# Patient Record
Sex: Female | Born: 1940
Health system: Southern US, Community
[De-identification: ages and names within clinical notes are randomized; demographics above are authoritative.]

## PROBLEM LIST (undated history)

## (undated) DIAGNOSIS — B0050 Herpesviral ocular disease, unspecified: Secondary | ICD-10-CM

## (undated) DIAGNOSIS — I499 Cardiac arrhythmia, unspecified: Secondary | ICD-10-CM

## (undated) DIAGNOSIS — L570 Actinic keratosis: Secondary | ICD-10-CM

## (undated) DIAGNOSIS — E039 Hypothyroidism, unspecified: Secondary | ICD-10-CM

## (undated) DIAGNOSIS — J189 Pneumonia, unspecified organism: Secondary | ICD-10-CM

## (undated) DIAGNOSIS — B019 Varicella without complication: Secondary | ICD-10-CM

## (undated) DIAGNOSIS — J45909 Unspecified asthma, uncomplicated: Secondary | ICD-10-CM

## (undated) DIAGNOSIS — M1990 Unspecified osteoarthritis, unspecified site: Secondary | ICD-10-CM

## (undated) DIAGNOSIS — T8859XA Other complications of anesthesia, initial encounter: Secondary | ICD-10-CM

## (undated) HISTORY — PX: BLADDER SUSPENSION: SHX72

## (undated) HISTORY — PX: FUNCTIONAL ENDOSCOPIC SINUS SURGERY: SUR616

## (undated) HISTORY — PX: COLONOSCOPY: SHX174

## (undated) HISTORY — DX: Actinic keratosis: L57.0

## (undated) HISTORY — PX: ABDOMINAL HYSTERECTOMY: SHX81

## (undated) HISTORY — PX: BREAST EXCISIONAL BIOPSY: SUR124

## (undated) HISTORY — PX: BREAST CYST ASPIRATION: SHX578

## (undated) HISTORY — PX: BREAST BIOPSY: SHX20

---

## 1970-02-24 HISTORY — PX: TUBAL LIGATION: SHX77

## 2004-08-22 ENCOUNTER — Ambulatory Visit: Payer: Self-pay | Admitting: Unknown Physician Specialty

## 2004-10-24 ENCOUNTER — Ambulatory Visit: Payer: Self-pay | Admitting: Unknown Physician Specialty

## 2005-06-16 ENCOUNTER — Other Ambulatory Visit: Payer: Self-pay

## 2005-06-20 ENCOUNTER — Ambulatory Visit: Payer: Self-pay | Admitting: Unknown Physician Specialty

## 2005-11-10 ENCOUNTER — Ambulatory Visit: Payer: Self-pay | Admitting: Unknown Physician Specialty

## 2006-10-12 ENCOUNTER — Ambulatory Visit: Payer: Self-pay | Admitting: Orthopaedic Surgery

## 2007-02-23 ENCOUNTER — Ambulatory Visit: Payer: Self-pay | Admitting: Unknown Physician Specialty

## 2007-02-25 HISTORY — PX: CATARACT EXTRACTION: SUR2

## 2008-03-15 ENCOUNTER — Ambulatory Visit: Payer: Self-pay | Admitting: Unknown Physician Specialty

## 2008-06-28 ENCOUNTER — Ambulatory Visit: Payer: Self-pay | Admitting: Unknown Physician Specialty

## 2009-08-21 ENCOUNTER — Ambulatory Visit: Payer: Self-pay | Admitting: Unknown Physician Specialty

## 2010-03-26 ENCOUNTER — Ambulatory Visit: Payer: Self-pay | Admitting: Ophthalmology

## 2010-04-08 ENCOUNTER — Ambulatory Visit: Payer: Self-pay | Admitting: Ophthalmology

## 2010-11-15 ENCOUNTER — Ambulatory Visit: Payer: Self-pay | Admitting: Unknown Physician Specialty

## 2011-01-08 ENCOUNTER — Ambulatory Visit: Payer: Self-pay | Admitting: Unknown Physician Specialty

## 2011-01-10 ENCOUNTER — Ambulatory Visit: Payer: Self-pay | Admitting: Unknown Physician Specialty

## 2011-11-27 ENCOUNTER — Ambulatory Visit: Payer: Self-pay | Admitting: Internal Medicine

## 2011-12-23 ENCOUNTER — Other Ambulatory Visit: Payer: Self-pay | Admitting: Internal Medicine

## 2011-12-23 LAB — TROPONIN I: Troponin-I: <0.02 ng/mL

## 2012-01-13 ENCOUNTER — Ambulatory Visit: Payer: Self-pay | Admitting: Internal Medicine

## 2013-01-19 ENCOUNTER — Ambulatory Visit: Payer: Self-pay | Admitting: Internal Medicine

## 2013-05-05 ENCOUNTER — Ambulatory Visit: Payer: Self-pay | Admitting: Unknown Physician Specialty

## 2013-05-10 LAB — PATHOLOGY REPORT

## 2014-01-24 ENCOUNTER — Ambulatory Visit: Payer: Self-pay | Admitting: Internal Medicine

## 2014-12-28 ENCOUNTER — Other Ambulatory Visit: Payer: Self-pay | Admitting: Internal Medicine

## 2014-12-28 DIAGNOSIS — Z1231 Encounter for screening mammogram for malignant neoplasm of breast: Secondary | ICD-10-CM

## 2015-01-09 ENCOUNTER — Ambulatory Visit: Payer: Self-pay

## 2015-01-10 ENCOUNTER — Ambulatory Visit: Payer: Self-pay

## 2015-01-22 ENCOUNTER — Ambulatory Visit
Admission: RE | Admit: 2015-01-22 | Discharge: 2015-01-22 | Disposition: A | Discharge status: Home / Self Care | Payer: Medicare Other | Source: Ambulatory Visit | Attending: Internal Medicine | Admitting: Internal Medicine

## 2015-01-22 DIAGNOSIS — Z1231 Encounter for screening mammogram for malignant neoplasm of breast: Secondary | ICD-10-CM

## 2015-03-28 DIAGNOSIS — M955 Acquired deformity of pelvis: Secondary | ICD-10-CM | POA: Diagnosis not present

## 2015-03-28 DIAGNOSIS — M9903 Segmental and somatic dysfunction of lumbar region: Secondary | ICD-10-CM | POA: Diagnosis not present

## 2015-03-28 DIAGNOSIS — M5416 Radiculopathy, lumbar region: Secondary | ICD-10-CM | POA: Diagnosis not present

## 2015-03-28 DIAGNOSIS — M9905 Segmental and somatic dysfunction of pelvic region: Secondary | ICD-10-CM | POA: Diagnosis not present

## 2015-03-30 DIAGNOSIS — E039 Hypothyroidism, unspecified: Secondary | ICD-10-CM | POA: Diagnosis not present

## 2015-04-17 ENCOUNTER — Other Ambulatory Visit: Payer: Self-pay | Admitting: Internal Medicine

## 2015-04-17 ENCOUNTER — Ambulatory Visit
Admission: RE | Admit: 2015-04-17 | Discharge: 2015-04-17 | Disposition: A | Discharge status: Home / Self Care | Payer: PPO | Source: Ambulatory Visit | Attending: Internal Medicine | Admitting: Internal Medicine

## 2015-04-17 DIAGNOSIS — Z1231 Encounter for screening mammogram for malignant neoplasm of breast: Secondary | ICD-10-CM | POA: Insufficient documentation

## 2015-04-25 DIAGNOSIS — B0052 Herpesviral keratitis: Secondary | ICD-10-CM | POA: Diagnosis not present

## 2015-04-26 DIAGNOSIS — B0052 Herpesviral keratitis: Secondary | ICD-10-CM | POA: Diagnosis not present

## 2015-05-01 DIAGNOSIS — B0052 Herpesviral keratitis: Secondary | ICD-10-CM | POA: Diagnosis not present

## 2015-05-03 DIAGNOSIS — M1612 Unilateral primary osteoarthritis, left hip: Secondary | ICD-10-CM | POA: Diagnosis not present

## 2015-05-03 DIAGNOSIS — M25552 Pain in left hip: Secondary | ICD-10-CM | POA: Diagnosis not present

## 2015-05-30 DIAGNOSIS — M5416 Radiculopathy, lumbar region: Secondary | ICD-10-CM | POA: Diagnosis not present

## 2015-05-30 DIAGNOSIS — M955 Acquired deformity of pelvis: Secondary | ICD-10-CM | POA: Diagnosis not present

## 2015-05-30 DIAGNOSIS — M9905 Segmental and somatic dysfunction of pelvic region: Secondary | ICD-10-CM | POA: Diagnosis not present

## 2015-05-30 DIAGNOSIS — M9903 Segmental and somatic dysfunction of lumbar region: Secondary | ICD-10-CM | POA: Diagnosis not present

## 2015-06-20 DIAGNOSIS — J4522 Mild intermittent asthma with status asthmaticus: Secondary | ICD-10-CM | POA: Diagnosis not present

## 2015-06-20 DIAGNOSIS — J40 Bronchitis, not specified as acute or chronic: Secondary | ICD-10-CM | POA: Diagnosis not present

## 2015-09-03 DIAGNOSIS — M5416 Radiculopathy, lumbar region: Secondary | ICD-10-CM | POA: Diagnosis not present

## 2015-09-03 DIAGNOSIS — M9903 Segmental and somatic dysfunction of lumbar region: Secondary | ICD-10-CM | POA: Diagnosis not present

## 2015-09-03 DIAGNOSIS — M9905 Segmental and somatic dysfunction of pelvic region: Secondary | ICD-10-CM | POA: Diagnosis not present

## 2015-09-03 DIAGNOSIS — M955 Acquired deformity of pelvis: Secondary | ICD-10-CM | POA: Diagnosis not present

## 2015-10-24 DIAGNOSIS — M9903 Segmental and somatic dysfunction of lumbar region: Secondary | ICD-10-CM | POA: Diagnosis not present

## 2015-10-24 DIAGNOSIS — M955 Acquired deformity of pelvis: Secondary | ICD-10-CM | POA: Diagnosis not present

## 2015-10-24 DIAGNOSIS — M9905 Segmental and somatic dysfunction of pelvic region: Secondary | ICD-10-CM | POA: Diagnosis not present

## 2015-10-24 DIAGNOSIS — M5416 Radiculopathy, lumbar region: Secondary | ICD-10-CM | POA: Diagnosis not present

## 2015-10-30 DIAGNOSIS — Z961 Presence of intraocular lens: Secondary | ICD-10-CM | POA: Diagnosis not present

## 2015-10-31 DIAGNOSIS — M9905 Segmental and somatic dysfunction of pelvic region: Secondary | ICD-10-CM | POA: Diagnosis not present

## 2015-10-31 DIAGNOSIS — M9903 Segmental and somatic dysfunction of lumbar region: Secondary | ICD-10-CM | POA: Diagnosis not present

## 2015-10-31 DIAGNOSIS — M5416 Radiculopathy, lumbar region: Secondary | ICD-10-CM | POA: Diagnosis not present

## 2015-10-31 DIAGNOSIS — M955 Acquired deformity of pelvis: Secondary | ICD-10-CM | POA: Diagnosis not present

## 2015-11-02 DIAGNOSIS — M9905 Segmental and somatic dysfunction of pelvic region: Secondary | ICD-10-CM | POA: Diagnosis not present

## 2015-11-02 DIAGNOSIS — M955 Acquired deformity of pelvis: Secondary | ICD-10-CM | POA: Diagnosis not present

## 2015-11-02 DIAGNOSIS — M9903 Segmental and somatic dysfunction of lumbar region: Secondary | ICD-10-CM | POA: Diagnosis not present

## 2015-11-02 DIAGNOSIS — M5416 Radiculopathy, lumbar region: Secondary | ICD-10-CM | POA: Diagnosis not present

## 2015-11-07 DIAGNOSIS — M9905 Segmental and somatic dysfunction of pelvic region: Secondary | ICD-10-CM | POA: Diagnosis not present

## 2015-11-07 DIAGNOSIS — M9903 Segmental and somatic dysfunction of lumbar region: Secondary | ICD-10-CM | POA: Diagnosis not present

## 2015-11-07 DIAGNOSIS — M955 Acquired deformity of pelvis: Secondary | ICD-10-CM | POA: Diagnosis not present

## 2015-11-07 DIAGNOSIS — M5416 Radiculopathy, lumbar region: Secondary | ICD-10-CM | POA: Diagnosis not present

## 2015-12-27 DIAGNOSIS — E039 Hypothyroidism, unspecified: Secondary | ICD-10-CM | POA: Diagnosis not present

## 2015-12-27 DIAGNOSIS — Z79899 Other long term (current) drug therapy: Secondary | ICD-10-CM | POA: Diagnosis not present

## 2015-12-31 DIAGNOSIS — Z0000 Encounter for general adult medical examination without abnormal findings: Secondary | ICD-10-CM | POA: Diagnosis not present

## 2016-01-21 DIAGNOSIS — M955 Acquired deformity of pelvis: Secondary | ICD-10-CM | POA: Diagnosis not present

## 2016-01-21 DIAGNOSIS — M9903 Segmental and somatic dysfunction of lumbar region: Secondary | ICD-10-CM | POA: Diagnosis not present

## 2016-01-21 DIAGNOSIS — M9905 Segmental and somatic dysfunction of pelvic region: Secondary | ICD-10-CM | POA: Diagnosis not present

## 2016-01-21 DIAGNOSIS — M5416 Radiculopathy, lumbar region: Secondary | ICD-10-CM | POA: Diagnosis not present

## 2016-01-24 DIAGNOSIS — M9903 Segmental and somatic dysfunction of lumbar region: Secondary | ICD-10-CM | POA: Diagnosis not present

## 2016-01-24 DIAGNOSIS — M955 Acquired deformity of pelvis: Secondary | ICD-10-CM | POA: Diagnosis not present

## 2016-01-24 DIAGNOSIS — M5416 Radiculopathy, lumbar region: Secondary | ICD-10-CM | POA: Diagnosis not present

## 2016-01-24 DIAGNOSIS — M9905 Segmental and somatic dysfunction of pelvic region: Secondary | ICD-10-CM | POA: Diagnosis not present

## 2016-01-28 DIAGNOSIS — M955 Acquired deformity of pelvis: Secondary | ICD-10-CM | POA: Diagnosis not present

## 2016-01-28 DIAGNOSIS — M5416 Radiculopathy, lumbar region: Secondary | ICD-10-CM | POA: Diagnosis not present

## 2016-01-28 DIAGNOSIS — M9903 Segmental and somatic dysfunction of lumbar region: Secondary | ICD-10-CM | POA: Diagnosis not present

## 2016-01-28 DIAGNOSIS — M9905 Segmental and somatic dysfunction of pelvic region: Secondary | ICD-10-CM | POA: Diagnosis not present

## 2016-01-31 DIAGNOSIS — M9903 Segmental and somatic dysfunction of lumbar region: Secondary | ICD-10-CM | POA: Diagnosis not present

## 2016-01-31 DIAGNOSIS — M955 Acquired deformity of pelvis: Secondary | ICD-10-CM | POA: Diagnosis not present

## 2016-01-31 DIAGNOSIS — M5416 Radiculopathy, lumbar region: Secondary | ICD-10-CM | POA: Diagnosis not present

## 2016-01-31 DIAGNOSIS — M9905 Segmental and somatic dysfunction of pelvic region: Secondary | ICD-10-CM | POA: Diagnosis not present

## 2016-02-27 DIAGNOSIS — M9903 Segmental and somatic dysfunction of lumbar region: Secondary | ICD-10-CM | POA: Diagnosis not present

## 2016-02-27 DIAGNOSIS — M9905 Segmental and somatic dysfunction of pelvic region: Secondary | ICD-10-CM | POA: Diagnosis not present

## 2016-02-27 DIAGNOSIS — M955 Acquired deformity of pelvis: Secondary | ICD-10-CM | POA: Diagnosis not present

## 2016-02-27 DIAGNOSIS — M5416 Radiculopathy, lumbar region: Secondary | ICD-10-CM | POA: Diagnosis not present

## 2016-03-14 ENCOUNTER — Other Ambulatory Visit: Payer: Self-pay | Admitting: Internal Medicine

## 2016-03-14 DIAGNOSIS — Z1231 Encounter for screening mammogram for malignant neoplasm of breast: Secondary | ICD-10-CM

## 2016-03-28 DIAGNOSIS — J4522 Mild intermittent asthma with status asthmaticus: Secondary | ICD-10-CM | POA: Diagnosis not present

## 2016-03-31 DIAGNOSIS — Z0000 Encounter for general adult medical examination without abnormal findings: Secondary | ICD-10-CM | POA: Diagnosis not present

## 2016-04-05 DIAGNOSIS — N390 Urinary tract infection, site not specified: Secondary | ICD-10-CM | POA: Diagnosis not present

## 2016-04-28 ENCOUNTER — Ambulatory Visit: Payer: PPO | Attending: Internal Medicine

## 2016-04-30 DIAGNOSIS — J40 Bronchitis, not specified as acute or chronic: Secondary | ICD-10-CM | POA: Diagnosis not present

## 2016-04-30 DIAGNOSIS — J4522 Mild intermittent asthma with status asthmaticus: Secondary | ICD-10-CM | POA: Diagnosis not present

## 2016-05-12 DIAGNOSIS — B0052 Herpesviral keratitis: Secondary | ICD-10-CM | POA: Diagnosis not present

## 2016-05-13 DIAGNOSIS — M25512 Pain in left shoulder: Secondary | ICD-10-CM | POA: Diagnosis not present

## 2016-05-13 DIAGNOSIS — M7542 Impingement syndrome of left shoulder: Secondary | ICD-10-CM | POA: Diagnosis not present

## 2016-06-04 ENCOUNTER — Ambulatory Visit
Admission: RE | Admit: 2016-06-04 | Discharge: 2016-06-04 | Disposition: A | Discharge status: Home / Self Care | Payer: PPO | Source: Ambulatory Visit | Attending: Internal Medicine | Admitting: Internal Medicine

## 2016-06-04 DIAGNOSIS — Z1231 Encounter for screening mammogram for malignant neoplasm of breast: Secondary | ICD-10-CM

## 2016-08-05 DIAGNOSIS — R238 Other skin changes: Secondary | ICD-10-CM | POA: Diagnosis not present

## 2016-09-15 DIAGNOSIS — R58 Hemorrhage, not elsewhere classified: Secondary | ICD-10-CM | POA: Diagnosis not present

## 2016-11-20 DIAGNOSIS — H179 Unspecified corneal scar and opacity: Secondary | ICD-10-CM | POA: Diagnosis not present

## 2016-11-25 DIAGNOSIS — J45902 Unspecified asthma with status asthmaticus: Secondary | ICD-10-CM | POA: Diagnosis not present

## 2016-11-25 DIAGNOSIS — J40 Bronchitis, not specified as acute or chronic: Secondary | ICD-10-CM | POA: Diagnosis not present

## 2016-12-15 DIAGNOSIS — D1800 Hemangioma unspecified site: Secondary | ICD-10-CM | POA: Diagnosis not present

## 2016-12-15 DIAGNOSIS — D692 Other nonthrombocytopenic purpura: Secondary | ICD-10-CM | POA: Diagnosis not present

## 2016-12-15 DIAGNOSIS — L430 Hypertrophic lichen planus: Secondary | ICD-10-CM | POA: Diagnosis not present

## 2016-12-15 DIAGNOSIS — D225 Melanocytic nevi of trunk: Secondary | ICD-10-CM | POA: Diagnosis not present

## 2016-12-15 DIAGNOSIS — D485 Neoplasm of uncertain behavior of skin: Secondary | ICD-10-CM | POA: Diagnosis not present

## 2016-12-15 DIAGNOSIS — Z1283 Encounter for screening for malignant neoplasm of skin: Secondary | ICD-10-CM | POA: Diagnosis not present

## 2016-12-15 DIAGNOSIS — L578 Other skin changes due to chronic exposure to nonionizing radiation: Secondary | ICD-10-CM | POA: Diagnosis not present

## 2016-12-15 DIAGNOSIS — L821 Other seborrheic keratosis: Secondary | ICD-10-CM | POA: Diagnosis not present

## 2016-12-15 DIAGNOSIS — L814 Other melanin hyperpigmentation: Secondary | ICD-10-CM | POA: Diagnosis not present

## 2016-12-29 DIAGNOSIS — Z0000 Encounter for general adult medical examination without abnormal findings: Secondary | ICD-10-CM | POA: Diagnosis not present

## 2016-12-29 DIAGNOSIS — E039 Hypothyroidism, unspecified: Secondary | ICD-10-CM | POA: Diagnosis not present

## 2016-12-29 DIAGNOSIS — E782 Mixed hyperlipidemia: Secondary | ICD-10-CM | POA: Diagnosis not present

## 2017-01-05 DIAGNOSIS — Z23 Encounter for immunization: Secondary | ICD-10-CM | POA: Diagnosis not present

## 2017-01-05 DIAGNOSIS — E039 Hypothyroidism, unspecified: Secondary | ICD-10-CM | POA: Diagnosis not present

## 2017-01-05 DIAGNOSIS — Z79899 Other long term (current) drug therapy: Secondary | ICD-10-CM | POA: Diagnosis not present

## 2017-01-05 DIAGNOSIS — Z0000 Encounter for general adult medical examination without abnormal findings: Secondary | ICD-10-CM | POA: Diagnosis not present

## 2017-01-05 DIAGNOSIS — E782 Mixed hyperlipidemia: Secondary | ICD-10-CM | POA: Diagnosis not present

## 2017-03-06 DIAGNOSIS — M25512 Pain in left shoulder: Secondary | ICD-10-CM | POA: Diagnosis not present

## 2017-03-06 DIAGNOSIS — M25552 Pain in left hip: Secondary | ICD-10-CM | POA: Diagnosis not present

## 2017-03-06 DIAGNOSIS — M7542 Impingement syndrome of left shoulder: Secondary | ICD-10-CM | POA: Diagnosis not present

## 2017-03-09 ENCOUNTER — Other Ambulatory Visit: Payer: Self-pay | Admitting: Physician Assistant

## 2017-03-09 DIAGNOSIS — M7542 Impingement syndrome of left shoulder: Secondary | ICD-10-CM

## 2017-03-17 ENCOUNTER — Ambulatory Visit
Admission: RE | Admit: 2017-03-17 | Discharge: 2017-03-17 | Disposition: A | Discharge status: Home / Self Care | Payer: PPO | Source: Ambulatory Visit | Attending: Physician Assistant | Admitting: Physician Assistant

## 2017-03-17 DIAGNOSIS — M25412 Effusion, left shoulder: Secondary | ICD-10-CM | POA: Insufficient documentation

## 2017-03-17 DIAGNOSIS — M7542 Impingement syndrome of left shoulder: Secondary | ICD-10-CM | POA: Diagnosis not present

## 2017-03-17 DIAGNOSIS — M19012 Primary osteoarthritis, left shoulder: Secondary | ICD-10-CM | POA: Insufficient documentation

## 2017-03-17 DIAGNOSIS — M7552 Bursitis of left shoulder: Secondary | ICD-10-CM | POA: Diagnosis not present

## 2017-03-24 DIAGNOSIS — N8111 Cystocele, midline: Secondary | ICD-10-CM | POA: Diagnosis not present

## 2017-03-24 DIAGNOSIS — N812 Incomplete uterovaginal prolapse: Secondary | ICD-10-CM | POA: Diagnosis not present

## 2017-03-24 DIAGNOSIS — N816 Rectocele: Secondary | ICD-10-CM | POA: Diagnosis not present

## 2017-04-01 ENCOUNTER — Other Ambulatory Visit: Payer: Self-pay | Admitting: Internal Medicine

## 2017-04-01 DIAGNOSIS — Z1231 Encounter for screening mammogram for malignant neoplasm of breast: Secondary | ICD-10-CM

## 2017-04-06 DIAGNOSIS — M19012 Primary osteoarthritis, left shoulder: Secondary | ICD-10-CM | POA: Diagnosis not present

## 2017-04-06 DIAGNOSIS — M7522 Bicipital tendinitis, left shoulder: Secondary | ICD-10-CM | POA: Diagnosis not present

## 2017-04-06 DIAGNOSIS — M7582 Other shoulder lesions, left shoulder: Secondary | ICD-10-CM | POA: Diagnosis not present

## 2017-04-15 DIAGNOSIS — N816 Rectocele: Secondary | ICD-10-CM | POA: Diagnosis not present

## 2017-04-15 DIAGNOSIS — N8111 Cystocele, midline: Secondary | ICD-10-CM | POA: Diagnosis not present

## 2017-04-15 DIAGNOSIS — N812 Incomplete uterovaginal prolapse: Secondary | ICD-10-CM | POA: Diagnosis not present

## 2017-04-16 DIAGNOSIS — M6281 Muscle weakness (generalized): Secondary | ICD-10-CM | POA: Diagnosis not present

## 2017-04-16 DIAGNOSIS — M25512 Pain in left shoulder: Secondary | ICD-10-CM | POA: Diagnosis not present

## 2017-04-16 DIAGNOSIS — M7522 Bicipital tendinitis, left shoulder: Secondary | ICD-10-CM | POA: Diagnosis not present

## 2017-04-16 DIAGNOSIS — M7582 Other shoulder lesions, left shoulder: Secondary | ICD-10-CM | POA: Diagnosis not present

## 2017-04-20 ENCOUNTER — Other Ambulatory Visit: Payer: Self-pay

## 2017-04-20 ENCOUNTER — Ambulatory Visit: Payer: PPO | Attending: Obstetrics and Gynecology

## 2017-04-20 DIAGNOSIS — N8110 Cystocele, unspecified: Secondary | ICD-10-CM | POA: Diagnosis not present

## 2017-04-20 DIAGNOSIS — M62838 Other muscle spasm: Secondary | ICD-10-CM | POA: Insufficient documentation

## 2017-04-20 DIAGNOSIS — K5900 Constipation, unspecified: Secondary | ICD-10-CM | POA: Diagnosis not present

## 2017-04-20 DIAGNOSIS — R293 Abnormal posture: Secondary | ICD-10-CM | POA: Diagnosis not present

## 2017-04-20 NOTE — Patient Instructions (Signed)
1)  2) Splinting: Insert thumb into vagina and press back toward the rectum to support the colon and allow for improved ability to empty bowels.  3) Kegel exercises:  4)  With neutral spine, tighten pelvic floor by imagining you are stopping the flow of urine, squeezing only around the vagina and anus.  Quick-Flicks: Pull up and in quickly and then relax allowing just enough time for the muscles to full lengthen before the next contraction. Do _10__ repetitions in a row, stopping if the pelvic floor muscle gets tired and other muscles try to take over.  Long-Holds: Hold for _10__ seconds and then fully release, repeat _8__ times.   Repeat both of these exercises _5__ times throughout the day   (once in the morning and at night lying down, otherwise you can be seated.  Put a pillow under your hips in this position when doing this exercise to help decrease pressure in the pelvis when it feels "heavy".

## 2017-04-20 NOTE — Therapy (Signed)
[place] MAIN [facility] SERVICES [address] [place], [place], [address] Phone: [phone]   Fax:  [phone]  Physical Therapy Treatment  Patient Details  Name: Amy Wells MRN: [id number] Date of Birth: Aug 15, 1940 Referring Provider: Rolm Bookbinder   Encounter Date: 04/20/2017    History reviewed. No pertinent past medical history.  Past Surgical History:  Procedure Laterality Date  . BREAST BIOPSY Left    neg  . BREAST CYST ASPIRATION Left    neg  . CATARACT EXTRACTION Right 2009  . TUBAL LIGATION  1972    There were no vitals filed for this visit.    Pelvic Floor Physical Therapy Evaluation and Assessment  SCREENING  Falls in last 6 mo: no  Patient's communication preference:   Red Flags:  Have you had any night sweats? no Unexplained weight loss? No  Saddle anesthesia? no Unexplained changes in bowel or bladder habits? no  SUBJECTIVE  Patient reports: Started having a feeling of increased pressure about three weeks ago and looking with a mirror and confirmed that she could see tissue. She has increased leakage when using prednisone. Has chronic asthmatic bronchitis.   Precautions:  none  Social/Family/Vocational History:   Retired, works as Air traffic controller with pessary MRI for L shoulder-impingement syndrome.  Obstetrical History: 3 vaginal deliveries, episiotomy with first.  Gynecological History: Benign ovarian cysts resolved with BC.   Urinary History: Mild leakage with stress only ovetr past 10 years.  Gastrointestinal History: Has a BM ~ 5/7 days a week. Will take a stool softener if needed ~ 2 days.  Sexual activity/pain: Not currently active due to fear of worsening prolapse.   Location of pain: L shoulder and LBP Current pain:  2/10  Max pain:  4/10 Least pain:  0/10 Nature of pain: ache/sore  Patient Goals: To reverse her prolapse as  much as possible and learn how to live without it changing her lifestyle.   OBJECTIVE  Posture/Observations:  Sitting: crossed legs, mild forward head and shoulders Standing: deferred to next visit  Palpation/Segmental Motion/Joint Play: deferred to next visit  Special tests:  deferred to next visit   Range of Motion/Flexibilty: deferred to next visit Spine: Hips:   Strength/MMT: deferred to next visit LE MMT  LE MMT Left Right  Hip flex:  (L2) /5 /5  Hip ext: /5 /5  Hip abd: /5 /5  Hip add: /5 /5  Hip IR /5 /5  Hip ER /5 /5     Abdominal: deferred to next visit Palpation: Diastasis:  Pelvic Floor External Exam: Introitus Appears: mild gaping Skin integrity: normal age-related changes Palpation: TTP through L STP only Cough: intact response Prolapse visible?: no Scar mobility: N/A  Internal Vaginal Exam: Strength (PERF):   Symmetry:4+/5, 10 sec. 2 times Palpation: TTP through all muscles B Prolapse: anterior and posterior wall visible to the level of introitus with bearing down.   Gait Analysis: deferred to next visit   Pelvic Floor Outcome Measures: PFDI: 80/300, PFIQ: 5/300  Interventions this session: Self-care:Educated on the structure and function of the pelvic floor in relation to their symptoms as well as the POC, and initial HEP in order to set patient expectations and understanding from which we will build on in the future sessions. Educated on [place] and splinting as well as soda-can theory to decrease straining and intra-abdominal pressure and prevent further prolapse. Therex: Educated on how to perform kegel with deep-lengthening breaths to maximize length/decrease spasm  wile continuing to strengthen.  Total time: 60 min.     [facility] PT Assessment - 04/20/17 0001      Assessment   Medical Diagnosis  Cystocele, Uterine prolapse, rectocele    Referring Provider  Dr.Beasley    Onset Date/Surgical Date  03/18/17      Precautions    Precautions  None      Restrictions   Weight Bearing Restrictions  No      Balance Screen   Has the patient fallen in the past 6 months  No    Has the patient had a decrease in activity level because of a fear of falling?   No    Is the patient reluctant to leave their home because of a fear of falling?   No      Home Film/video editor residence    Living Arrangements  Spouse/significant other    Available Help at Discharge  Family    Type of [place] to enter    Entrance Stairs-Number of Steps  2    Entrance Stairs-Rails  None    Home Layout  Two level    Alternate Level Stairs-Number of Steps  14    Alternate Level Stairs-Rails  Left      Prior Function   Level of Independence  Independent    Vocation  Retired;Part time employment    Vocation Requirements  office work    Leisure  travelling, hiking, yoga, reading, study                            PT Short Term Goals - 04/20/17 1203      PT [place] #1   Title  Patient will demonstrate functional recruitment of TA with breathing, sit-to-stand, squatting/lifting, and walking to allow for improved pelvic brace coordination, improved balance, and decreased downward pressure on the pelvic organs.    Time  5    Period  Weeks    Status  New    Target Date  05/25/17      PT SHORT TERM GOAL #2   Title  Patient will demonstrate improved sitting and standing posture to demonstrate learning and decrease stress on the pelvic floor with functional activity.    Time  5    Period  Weeks    Status  New    Target Date  05/25/17      PT SHORT TERM GOAL #3   Title  Patient will describe feeling of pressure no more than 20% of the time over the course of the past week to demonstrate improved recruitment and strength of the pelvic floor.    Time  5    Period  Weeks    Status  New    Target Date  05/25/17        PT Long Term Goals - 04/20/17 1205       PT LONG TERM GOAL #1   Title  Patient will report no feeling of pelvic pressure/heviness lasting longer than 10 min. over prior week to demonstrate learning, miproved body mechanics and posture, and ability to implement strategies to decrease downward pressure on POP.     Time  10    Period  Weeks    Status  New    Target Date  06/29/17      PT LONG TERM GOAL #2   Title  Patient will score at or below 35/300 on the PFDI and 0/300 on the PFIQ to demonstrate a clinically meaningful decrease in disability and distress due to pelvic floor dysfunction.    Baseline  Pelvic Floor Outcome Measures: PFDI: 80/300, PFIQ: 5/300    Time  10    Period  Weeks    Status  New    Target Date  06/29/17      PT LONG TERM GOAL #3   Title  Patient will report having BM's with consistency between [facility] stool scale 3-5 at least every-other day without need for stool-softener over the prior week to demonstrate decreased constipation.    Time  10    Period  Weeks    Status  New    Target Date  06/29/17      PT LONG TERM GOAL #4   Title  Patient will report no episodes of SUI over the course of the prior two weeks to demonstrate improved functional ability.    Time  10    Period  Weeks    Status  New    Target Date  06/29/17            Plan - 04/20/17 1136    Clinical Impression Statement  Pt. is a 77 y/o female who presents today with cheif c/o cystocele and rectocele causing intermittent feeling of pressure, constipation, and mild urinary incontinence. Patient history is significant for being post-menopausal, havig 3 vaginal deliveries, having chrinic asthmatic broncitis flare-ups, and leading a physically active life including doing yoga, hiking and travelling. Clinical findings today include prolapse of both anterior and posterior pelvic walls to the level of the introitus, pelvic floor spasms, good PFM strength and coordination. Patient will benefit from skilled PT to address the noted defecits and  to futher assess the spine and hipt to determint to what extent they are affecting the patient's pelvic alignment and symptoms.     History and Personal Factors relevant to plan of care:  Will be travelling to [place] and [place] soon, will be gone for 2 weeks.    Clinical Presentation  Stable    Clinical Decision Making  Low    Rehab Potential  Excellent    Clinical Impairments Affecting Rehab Potential  Chronic Asthmatic bronchitis     PT Frequency  1x / week    PT Duration  Other (comment) 10 weeks    PT Treatment/Interventions  ADLs/Self Care Home Management;Functional mobility training;Stair training;Therapeutic activities;Therapeutic exercise;Balance training;Neuromuscular re-education;Manual techniques;Patient/family education;Dry needling    PT Next Visit Plan  Assess gait, abdomen, Hip ROM/strength. Educate on static posture, bending/lifting/standing/supine-to-sit and walking posture.    PT Home Exercise Plan  squatty potty, lengthening/strengthening kegels, splinting, soda-can    Consulted and Agree with Plan of Care  Patient       Patient will benefit from skilled therapeutic intervention in order to improve the following deficits and impairments:  Improper body mechanics, Pain, Increased muscle spasms, Postural dysfunction, Decreased activity tolerance, Decreased strength, Difficulty walking  Visit Diagnosis: Other muscle spasm  Abnormal posture  Prolapse of vaginal wall  Constipation, unspecified constipation type     Problem List There are no active problems to display for this patient.  Willa Rough DPT, ATC Willa Rough 04/20/2017, 12:13 PM  [place] MAIN [facility] SERVICES [address] [place], [place], [address] Phone: [phone]   Fax:  [phone]  Name: Malvina Schadler MRN: [id number] Date of Birth: 01-Sep-1940

## 2017-04-30 ENCOUNTER — Ambulatory Visit: Payer: PPO | Attending: Obstetrics and Gynecology

## 2017-04-30 DIAGNOSIS — N8110 Cystocele, unspecified: Secondary | ICD-10-CM | POA: Diagnosis not present

## 2017-04-30 DIAGNOSIS — K5900 Constipation, unspecified: Secondary | ICD-10-CM | POA: Insufficient documentation

## 2017-04-30 DIAGNOSIS — M7582 Other shoulder lesions, left shoulder: Secondary | ICD-10-CM | POA: Diagnosis not present

## 2017-04-30 DIAGNOSIS — M62838 Other muscle spasm: Secondary | ICD-10-CM

## 2017-04-30 DIAGNOSIS — R293 Abnormal posture: Secondary | ICD-10-CM | POA: Diagnosis not present

## 2017-04-30 NOTE — Patient Instructions (Addendum)
Stabilization: Sit to Stand Transfer, Pelvic Floor Contraction    Sit, feet flat, contract pelvic floor and exhale as if stopping urination. Bend forward at hips, stand. To sit back down, psh bottom back and lower slowly with exhale.   Copyright  VHI. All rights reserved.    **DEEP CORE STABILIZERS to work out, core strengthening in neutral spine (plank etc) Avoid sit-ups, keep back on ground if doing abdominal exercise.    Do 3x10 focusing on keeping your weight back on your heels, pushing your bottom back a little bit more than you think you need to.     Do 3x10 focusing on keeping your weight back on your heels, pushing your bottom back a little bit more than you think you need to.   With squat, keep torso and lower leg in parallell, breath in as you go down, out on the way up.

## 2017-04-30 NOTE — Therapy (Signed)
[place] MAIN [facility] SERVICES [address] [place], [place], [address] Phone: [phone]   Fax:  [phone]  Physical Therapy Treatment  Patient Details  Name: Amy Wells MRN: [id number] Date of Birth: 16-Apr-1940 Referring Provider: Rolm Bookbinder   Encounter Date: 04/30/2017  PT End of Session - 04/30/17 1222    Visit Number  2    Number of Visits  10    Date for PT Re-Evaluation  06/29/17    PT Start Time  6301    PT Stop Time  1213    PT Time Calculation (min)  68 min    Activity Tolerance  Patient tolerated treatment well    Behavior During Therapy  [facility] for tasks assessed/performed       No past medical history on file.  Past Surgical History:  Procedure Laterality Date  . BREAST BIOPSY Left    neg  . BREAST CYST ASPIRATION Left    neg  . CATARACT EXTRACTION Right 2009  . TUBAL LIGATION  1972    There were no vitals filed for this visit.    Pelvic Floor Physical Therapy Treatment Note  SCREENING  Changes in medications, allergies, or medical history?: no     SUBJECTIVE  Patient reports: Has been faithfully doing her exercises, has not had any leakage and has not had any urgency, has not motivaed a difference in "bulge" yet. Travelled without getting sick. Is using a nasal rinse nightly also took 50000 units of Vitamin D  Pain update: No pain in spine   Patient Goals: To reverse her prolapse as much as possible and learn how to live without it changing her lifestyle.   OBJECTIVE  Changes in: Posture/Observations:  Mild Anterior R rotation, L "apparent" long in standing.  No difference in rotation. "pinchy" with L side-bending, stretch B with side-bend.   Gait Analysis: Patient has overall very good mechanics with slightly decreased thoracic rotation and under-engagement of the TA. Patient able to correct with cueing.  INTERVENTIONS THIS SESSION: Self-care: Educated on pessary use in the context of  pelvic floor PT and her travel schedule to allow optimal function without becoming reliant. Educated on how to know whether an abdominal exercise is "safe" for her and what to tell her trainer to help him guide her appropriately.  NM re-ed: worked on posture in sitting, standing, and ambulating with focus on gentle TA contraction throughout. Educated on and practiced 1X10 of: Sit-to-stand, BW squats, combination squat-row with TRX, and mini squat with shoulder retraction with band focusing on TA and PFM timing to re-instate anticipatory retraction and glute engagement to decrease anterior pelvic tilt and lower-crossed syndrome. As well as help patient have "safe exercsises" she can work on to help her stay in shape. Educated on using pursed and opening lips to encourage correct lengthening/strengthening of vaginal and anal sphincters.      Total time:68 min.                        PT Education - 04/30/17 1221    Education provided  Yes    Education Details  See Pt. instructions and interventions this session.    Person(s) Educated  Patient    Methods  Explanation;Tactile cues;Demonstration;Verbal cues;Handout    Comprehension  Verbalized understanding;Returned demonstration;Verbal cues required;Tactile cues required       PT Short Term Goals - 04/20/17 1203      PT SHORT TERM GOAL #1  Title  Patient will demonstrate functional recruitment of TA with breathing, sit-to-stand, squatting/lifting, and walking to allow for improved pelvic brace coordination, improved balance, and decreased downward pressure on the pelvic organs.    Time  5    Period  Weeks    Status  New    Target Date  05/25/17      PT SHORT TERM GOAL #2   Title  Patient will demonstrate improved sitting and standing posture to demonstrate learning and decrease stress on the pelvic floor with functional activity.    Time  5    Period  Weeks    Status  New    Target Date  05/25/17      PT SHORT TERM  GOAL #3   Title  Patient will describe feeling of pressure no more than 20% of the time over the course of the past week to demonstrate improved recruitment and strength of the pelvic floor.    Time  5    Period  Weeks    Status  New    Target Date  05/25/17        PT Long Term Goals - 04/20/17 1205      PT LONG TERM GOAL #1   Title  Patient will report no feeling of pelvic pressure/heviness lasting longer than 10 min. over prior week to demonstrate learning, miproved body mechanics and posture, and ability to implement strategies to decrease downward pressure on POP.     Time  10    Period  Weeks    Status  New    Target Date  06/29/17      PT LONG TERM GOAL #2   Title  Patient will score at or below 35/300 on the PFDI and 0/300 on the PFIQ to demonstrate a clinically meaningful decrease in disability and distress due to pelvic floor dysfunction.    Baseline  Pelvic Floor Outcome Measures: PFDI: 80/300, PFIQ: 5/300    Time  10    Period  Weeks    Status  New    Target Date  06/29/17      PT LONG TERM GOAL #3   Title  Patient will report having BM's with consistency between [facility] stool scale 3-5 at least every-other day without need for stool-softener over the prior week to demonstrate decreased constipation.    Time  10    Period  Weeks    Status  New    Target Date  06/29/17      PT LONG TERM GOAL #4   Title  Patient will report no episodes of SUI over the course of the prior two weeks to demonstrate improved functional ability.    Time  10    Period  Weeks    Status  New    Target Date  06/29/17            Plan - 04/30/17 1227    Clinical Impression Statement  Pt. is responding well to therapy and demonstrates learning of all education provided quickly She has had no UI since last visit but has had mild constipation and no change in feeling of pressure yet. Continue per POC.     Clinical Presentation  Stable    Clinical Decision Making  Low    Rehab Potential   Excellent    Clinical Impairments Affecting Rehab Potential  Chronic Asthmatic bronchitis     PT Frequency  1x / week    PT Duration  Other (comment) 10  weeks    PT Treatment/Interventions  ADLs/Self Care Home Management;Functional mobility training;Stair training;Therapeutic activities;Therapeutic exercise;Balance training;Neuromuscular re-education;Manual techniques;Patient/family education;Dry needling    PT Next Visit Plan  Assess abdomen, Hip ROM/strength. Correct R anterior inominate rotation, and manual for improved posture. then internal TP release at following.    PT Home Exercise Plan  squatty potty, lengthening/strengthening kegels, splinting, soda-can, squat-rows    Consulted and Agree with Plan of Care  Patient       Patient will benefit from skilled therapeutic intervention in order to improve the following deficits and impairments:  Improper body mechanics, Pain, Increased muscle spasms, Postural dysfunction, Decreased activity tolerance, Decreased strength, Difficulty walking  Visit Diagnosis: Other muscle spasm  Abnormal posture  Prolapse of vaginal wall  Constipation, unspecified constipation type     Problem List There are no active problems to display for this patient.  Willa Rough DPT, ATC Willa Rough 04/30/2017, 12:33 PM  [place] MAIN [facility] SERVICES [address] [place], [place], [address] Phone: [phone]   Fax:  [phone]  Name: Sritha Chauncey MRN: [id number] Date of Birth: 10-31-1940

## 2017-05-04 ENCOUNTER — Ambulatory Visit: Payer: PPO

## 2017-05-04 DIAGNOSIS — N8110 Cystocele, unspecified: Secondary | ICD-10-CM

## 2017-05-04 DIAGNOSIS — M62838 Other muscle spasm: Secondary | ICD-10-CM

## 2017-05-04 DIAGNOSIS — K5900 Constipation, unspecified: Secondary | ICD-10-CM

## 2017-05-04 DIAGNOSIS — R293 Abnormal posture: Secondary | ICD-10-CM

## 2017-05-04 NOTE — Therapy (Signed)
[place] MAIN [facility] SERVICES [address] [place], [place], [address] Phone: [phone]   Fax:  [phone]  Physical Therapy Treatment  Patient Details  Name: Amy Wells MRN: [id number] Date of Birth: [birth year] Referring Provider: Rolm Bookbinder   Encounter Date: 05/04/2017  PT End of Session - 05/04/17 1657    Visit Number  3    Number of Visits  10    Date for PT Re-Evaluation  06/29/17    PT Start Time  5027    PT Stop Time  1135    PT Time Calculation (min)  60 min    Activity Tolerance  Patient tolerated treatment well    Behavior During Therapy  [facility] for tasks assessed/performed       No past medical history on file.  Past Surgical History:  Procedure Laterality Date  . BREAST BIOPSY Left    neg  . BREAST CYST ASPIRATION Left    neg  . CATARACT EXTRACTION Right 2009  . TUBAL LIGATION  1972    There were no vitals filed for this visit.    Pelvic Floor Physical Therapy Treatment Note  SCREENING  Changes in medications, allergies, or medical history?: no     SUBJECTIVE  Patient reports: Feels that she might be noticing a little less pressure in the vagina yesterday and was able to focus on her posture throughout. At the end of 1.5 miles she could feel the bladder but less so than normal.  Pain update: No pain in spine  Patient Goals: To reverse her prolapse as much as possible and learn how to live without it changing her lifestyle.   OBJECTIVE Range of Motion/Flexibilty:  Spine: Hips: -5 deg. On L, -3 on R. IR/ER B 20/40   Strength/MMT:  LE MMT  LE MMT Left Right  Hip flex:  (L2) /5 /5  Hip ext: 5/5 5/5  Hip abd: 4+/5 4+/5  Hip add: 4+/5 5/5  Hip IR 4+/5 4+/5  Hip ER 5/5 5/5     Abdominal:  Palpation: TTP to R psoas and iliacus, TTP to L obliques and QL. Restricted motion of the scar from her tubal ligation in cephalic direction.  Diastasis: none   Changes in: Posture/Observations:   Patient demonstrated improved pelvic alignment following manual but continues to have a minor R anterior rotation.  INTERVENTIONS THIS SESSION: Manual: Assessed Hip ROM and strength and abdomen for further POC development. Performed and educated on scar mobilization to improve TA recruitment. Performed TP release to L oblique and adductors and R psoas to decrease tension on the pelvis for better response to MET correction for R rotation. Therex: Educated on hip-flexor stretch/hold relax lengthening to decrease pull into anterior rotation and allow for improved posture/TA recruitment.  Total time: 65 min.                          PT Education - 05/04/17 1656    Education provided  Yes    Education Details  See Pt. instructions and Interventions this session    Person(s) Educated  Patient    Methods  Explanation;Demonstration;Tactile cues;Verbal cues;Handout    Comprehension  Verbalized understanding;Returned demonstration;Verbal cues required;Tactile cues required       PT Short Term Goals - 04/20/17 1203      PT SHORT TERM GOAL #1   Title  Patient will demonstrate functional recruitment of TA with breathing, sit-to-stand, squatting/lifting, and walking to allow for  improved pelvic brace coordination, improved balance, and decreased downward pressure on the pelvic organs.    Time  5    Period  Weeks    Status  New    Target Date  05/25/17      PT SHORT TERM GOAL #2   Title  Patient will demonstrate improved sitting and standing posture to demonstrate learning and decrease stress on the pelvic floor with functional activity.    Time  5    Period  Weeks    Status  New    Target Date  05/25/17      PT SHORT TERM GOAL #3   Title  Patient will describe feeling of pressure no more than 20% of the time over the course of the past week to demonstrate improved recruitment and strength of the pelvic floor.    Time  5    Period  Weeks    Status  New    Target Date   05/25/17        PT Long Term Goals - 04/20/17 1205      PT LONG TERM GOAL #1   Title  Patient will report no feeling of pelvic pressure/heviness lasting longer than 10 min. over prior week to demonstrate learning, miproved body mechanics and posture, and ability to implement strategies to decrease downward pressure on POP.     Time  10    Period  Weeks    Status  New    Target Date  06/29/17      PT LONG TERM GOAL #2   Title  Patient will score at or below 35/300 on the PFDI and 0/300 on the PFIQ to demonstrate a clinically meaningful decrease in disability and distress due to pelvic floor dysfunction.    Baseline  Pelvic Floor Outcome Measures: PFDI: 80/300, PFIQ: 5/300    Time  10    Period  Weeks    Status  New    Target Date  06/29/17      PT LONG TERM GOAL #3   Title  Patient will report having BM's with consistency between [facility] stool scale 3-5 at least every-other day without need for stool-softener over the prior week to demonstrate decreased constipation.    Time  10    Period  Weeks    Status  New    Target Date  06/29/17      PT LONG TERM GOAL #4   Title  Patient will report no episodes of SUI over the course of the prior two weeks to demonstrate improved functional ability.    Time  10    Period  Weeks    Status  New    Target Date  06/29/17            Plan - 05/04/17 1658    Clinical Impression Statement  Pt. responded well to all interventions today and demonstrated understandion of education provided as well as improved pelvic alignment following treatment. Continue per POC.    Clinical Presentation  Stable    Clinical Decision Making  Low    Rehab Potential  Excellent    Clinical Impairments Affecting Rehab Potential  Chronic Asthmatic bronchitis     PT Frequency  1x / week    PT Duration  Other (comment) 10 weeks    PT Treatment/Interventions  ADLs/Self Care Home Management;Functional mobility training;Stair training;Therapeutic  activities;Therapeutic exercise;Balance training;Neuromuscular re-education;Manual techniques;Patient/family education;Dry needling    PT Next Visit Plan  Re-address R anterior inominate rotation,  PA to sacrum, and manual for improved posture. then internal TP release.    PT Home Exercise Plan  squatty potty, lengthening/strengthening kegels, splinting, soda-can, squat-rows, MET for R anterior rotation, hip-flexor stretch.    Consulted and Agree with Plan of Care  Patient       Patient will benefit from skilled therapeutic intervention in order to improve the following deficits and impairments:  Improper body mechanics, Pain, Increased muscle spasms, Postural dysfunction, Decreased activity tolerance, Decreased strength, Difficulty walking  Visit Diagnosis: Other muscle spasm  Abnormal posture  Prolapse of vaginal wall  Constipation, unspecified constipation type     Problem List There are no active problems to display for this patient.  Willa Rough DPT, ATC Willa Rough 05/04/2017, 5:15 PM  [place] MAIN [facility] SERVICES [address] [place], [place], [address] Phone: [phone]   Fax:  [phone]  Name: Amy Wells MRN: [id number] Date of Birth: 08-Jul-1940

## 2017-05-04 NOTE — Patient Instructions (Signed)
  Hold against the strap for 5 seconds, relax and take up the slack in-between reps, repeat 5 times and then hold for 30 seconds at the end in a stretch. Prop your hip up on a yoga block to get a deeper stretch.    With right leg over and Left leg under stick, press into the stick and hold for 5 seconds, repeat 5 times. Do this 1 time per day.

## 2017-05-11 ENCOUNTER — Ambulatory Visit: Payer: PPO

## 2017-05-11 DIAGNOSIS — K5900 Constipation, unspecified: Secondary | ICD-10-CM

## 2017-05-11 DIAGNOSIS — N812 Incomplete uterovaginal prolapse: Secondary | ICD-10-CM | POA: Diagnosis not present

## 2017-05-11 DIAGNOSIS — M62838 Other muscle spasm: Secondary | ICD-10-CM | POA: Diagnosis not present

## 2017-05-11 DIAGNOSIS — Z4689 Encounter for fitting and adjustment of other specified devices: Secondary | ICD-10-CM | POA: Diagnosis not present

## 2017-05-11 DIAGNOSIS — N8110 Cystocele, unspecified: Secondary | ICD-10-CM

## 2017-05-11 DIAGNOSIS — R293 Abnormal posture: Secondary | ICD-10-CM

## 2017-05-11 NOTE — Patient Instructions (Signed)
  Shoulder Retraction and Downward Rotation      Start with Shoulder Retraction and Downward Rotation pictures above, holding the position while pulling the chin straight back as if trying to make a "double chin".  Breathe in forward and breathe out as you pull back, repeating this _10x3__ times __1-3__ times per day.    Place foam roller or towel under your upper back between your shoulder blades. Support your head with your hands, elbows forward, and gently rock back and forth and side to side to improve motion in your back.   Move the foam roller or towel up and down to a few spots in the upper back, repeating the process.

## 2017-05-11 NOTE — Therapy (Signed)
[place] MAIN [facility] SERVICES [address] [place], [place], [address] Phone: [phone]   Fax:  [phone]  Physical Therapy Treatment  Patient Details  Name: Amy Wells MRN: [id number] Date of Birth: [birth year] Referring Provider: Rolm Bookbinder   Encounter Date: 05/11/2017  PT End of Session - 05/11/17 1151    Visit Number  4    Number of Visits  10    Date for PT Re-Evaluation  06/29/17    PT Start Time  1031    PT Stop Time  1136    PT Time Calculation (min)  65 min    Activity Tolerance  Patient tolerated treatment well    Behavior During Therapy  [facility] for tasks assessed/performed       No past medical history on file.  Past Surgical History:  Procedure Laterality Date  . BREAST BIOPSY Left    neg  . BREAST CYST ASPIRATION Left    neg  . CATARACT EXTRACTION Right 2009  . TUBAL LIGATION  1972    There were no vitals filed for this visit.    Pelvic Floor Physical Therapy Treatment Note  SCREENING  Changes in medications, allergies, or medical history?: no     SUBJECTIVE  Patient reports: She is doing well, she notes mild improvement, feeling a little less pressure in the morning.  Pain update: None   Patient Goals: To reverse her prolapse as much as possible and learn how to live without it changing her lifestyle.    OBJECTIVE  Changes in: Posture/Observations:  Hyperkyphotic, forward head and shoulders. Improved ability to attain correct posture following manual treatment.  INTERVENTIONS THIS SESSION: Manual: Performed grade 3-4 PA mobs to sacrum as well as thoracic spine and TP release to B pectoralis muscles, upper trapezius, and deep neck extensors to improve mobility for better pelvic position and PFM recruitment.  Therex: Educated on and practiced thoracic extensions over towel roll and chin tucks in seated and supine to improve cervicothoracic mobility and improve posture to allow for optimal  recruitment of the PFM.   Total time: 65 min.                         PT Education - 05/11/17 1150    Education provided  Yes    Education Details  See Pt Instructions and interventions this session.    Person(s) Educated  Patient    Methods  Explanation;Demonstration;Tactile cues;Verbal cues;Handout    Comprehension  Verbalized understanding;Returned demonstration;Verbal cues required;Tactile cues required       PT Short Term Goals - 04/20/17 1203      PT SHORT TERM GOAL #1   Title  Patient will demonstrate functional recruitment of TA with breathing, sit-to-stand, squatting/lifting, and walking to allow for improved pelvic brace coordination, improved balance, and decreased downward pressure on the pelvic organs.    Time  5    Period  Weeks    Status  New    Target Date  05/25/17      PT SHORT TERM GOAL #2   Title  Patient will demonstrate improved sitting and standing posture to demonstrate learning and decrease stress on the pelvic floor with functional activity.    Time  5    Period  Weeks    Status  New    Target Date  05/25/17      PT SHORT TERM GOAL #3   Title  Patient will describe feeling of  pressure no more than 20% of the time over the course of the past week to demonstrate improved recruitment and strength of the pelvic floor.    Time  5    Period  Weeks    Status  New    Target Date  05/25/17        PT Long Term Goals - 04/20/17 1205      PT LONG TERM GOAL #1   Title  Patient will report no feeling of pelvic pressure/heviness lasting longer than 10 min. over prior week to demonstrate learning, miproved body mechanics and posture, and ability to implement strategies to decrease downward pressure on POP.     Time  10    Period  Weeks    Status  New    Target Date  06/29/17      PT LONG TERM GOAL #2   Title  Patient will score at or below 35/300 on the PFDI and 0/300 on the PFIQ to demonstrate a clinically meaningful decrease in  disability and distress due to pelvic floor dysfunction.    Baseline  Pelvic Floor Outcome Measures: PFDI: 80/300, PFIQ: 5/300    Time  10    Period  Weeks    Status  New    Target Date  06/29/17      PT LONG TERM GOAL #3   Title  Patient will report having BM's with consistency between [facility] stool scale 3-5 at least every-other day without need for stool-softener over the prior week to demonstrate decreased constipation.    Time  10    Period  Weeks    Status  New    Target Date  06/29/17      PT LONG TERM GOAL #4   Title  Patient will report no episodes of SUI over the course of the prior two weeks to demonstrate improved functional ability.    Time  10    Period  Weeks    Status  New    Target Date  06/29/17            Plan - 05/11/17 1151    Clinical Impression Statement  Pt. responded well to education, demonstrating understanding, and she demonstrated improved posture and decreased discomfort in neck/back following manual treatment. Continue per POC.    Clinical Presentation  Stable    Clinical Decision Making  Low    Rehab Potential  Excellent    Clinical Impairments Affecting Rehab Potential  Chronic Asthmatic bronchitis     PT Frequency  1x / week    PT Duration  Other (comment)    PT Treatment/Interventions  ADLs/Self Care Home Management;Functional mobility training;Stair training;Therapeutic activities;Therapeutic exercise;Balance training;Neuromuscular re-education;Manual techniques;Patient/family education;Dry needling    PT Next Visit Plan  Re-address R anterior inominate rotation, then internal TP release.    PT Home Exercise Plan  squatty potty, lengthening/strengthening kegels, splinting, soda-can, squat-rows, MET for R anterior rotation, hip-flexor stretch, chin-tucks and thoracic extensions over towel roll.    Consulted and Agree with Plan of Care  Patient       Patient will benefit from skilled therapeutic intervention in order to improve the  following deficits and impairments:  Improper body mechanics, Pain, Increased muscle spasms, Postural dysfunction, Decreased activity tolerance, Decreased strength, Difficulty walking  Visit Diagnosis: Other muscle spasm  Abnormal posture  Prolapse of vaginal wall  Constipation, unspecified constipation type     Problem List There are no active problems to display for this patient.  Elon Alas  Angeline Slim DPT, ATC Willa Rough 05/11/2017, 11:56 AM  [place] MAIN [facility] SERVICES [address] [place], [place], [address] Phone: [phone]   Fax:  [phone]  Name: Amy Wells MRN: [id number] Date of Birth: May 07, 1940

## 2017-05-14 DIAGNOSIS — M7582 Other shoulder lesions, left shoulder: Secondary | ICD-10-CM | POA: Diagnosis not present

## 2017-05-18 ENCOUNTER — Ambulatory Visit: Payer: PPO

## 2017-05-18 DIAGNOSIS — N8110 Cystocele, unspecified: Secondary | ICD-10-CM

## 2017-05-18 DIAGNOSIS — M62838 Other muscle spasm: Secondary | ICD-10-CM | POA: Diagnosis not present

## 2017-05-18 DIAGNOSIS — R293 Abnormal posture: Secondary | ICD-10-CM

## 2017-05-18 DIAGNOSIS — K5900 Constipation, unspecified: Secondary | ICD-10-CM

## 2017-05-18 NOTE — Patient Instructions (Signed)
1) You can make coconut oil suppositories with or without vitamin E oil to help with dryness of tissue.   2) Bracing With Arm / Leg Raise (Quadruped)    On hands and knees find neutral spine. Tighten pelvic floor and abdominals and hold. Alternating, lift arm to shoulder level and opposite leg to hip level. Repeat _10x2__ times. Do __1_ times a day.   3) decrease the MET corrections with stick down to 2-3 times per week.  4) Focus on deep core with abdominal exercises, stop when you lose lower tummy muscle control.   5) Continue hip stretch daily for 4 ore weeks, then do it following exercise or at least 3 times per week to sustain.

## 2017-05-18 NOTE — Therapy (Signed)
[place] MAIN [facility] SERVICES [address] [place], [place], [address] Phone: [phone]   Fax:  [phone]  Physical Therapy Treatment  Patient Details  Name: Noela Brothers MRN: [id number] Date of Birth: 1940-12-19 Referring Provider: Rolm Bookbinder   Encounter Date: 05/18/2017  PT End of Session - 05/18/17 1143    Visit Number  5    Number of Visits  10    Date for PT Re-Evaluation  06/29/17    PT Start Time  2774    PT Stop Time  1133    PT Time Calculation (min)  57 min    Activity Tolerance  Patient tolerated treatment well    Behavior During Therapy  [facility] for tasks assessed/performed       No past medical history on file.  Past Surgical History:  Procedure Laterality Date  . BREAST BIOPSY Left    neg  . BREAST CYST ASPIRATION Left    neg  . CATARACT EXTRACTION Right 2009  . TUBAL LIGATION  1972    There were no vitals filed for this visit.    Pelvic Floor Physical Therapy Treatment Note  SCREENING  Changes in medications, allergies, or medical history?:no     SUBJECTIVE  Patient reports: Had difficulty with pessary. She feels that she is doing better. She is not having to urinate frequently or anything else, her biggest symptom is anxiety that it will get worse. She wants to work on stomach muscles more. She was able to go [place] walk [place] today without thinking about/noticing the prolapse.   Pain update: No pain.  Patient Goals: To reverse her prolapse as much as possible and learn how to live without it changing her lifestyle.   OBJECTIVE  Changes in: Posture/Observations:  Posture looks excellent though patient still has some difficulty maintaining TA contraction with certain exercises.  Pelvic floor: Strength is 4+/5 before treatment today and almost a full 5/5 following TP release today.  INTERVENTIONS THIS SESSION: Manual: Re-assessed PFM strength and performed TP release to  multiple TP's internally R>L with full resolution and improved recruitment of PFM following release. Therex: Educated on and practiced bird-dog and dead bug with emphasis on maintaining deep-core stabilizer engagement. Self-care: discussed using coconut oil and vitamin E oil suppositories to help with vaginal tissue dryness and mobility. Discussed POC moving forward.   Total time: 57 min.           No data recorded               PT Education - 05/18/17 1144    Education provided  Yes    Education Details  See Pt. Instructions and Interventions this session.    Person(s) Educated  Patient    Methods  Explanation;Demonstration;Verbal cues;Handout    Comprehension  Verbalized understanding;Returned demonstration;Verbal cues required       PT Short Term Goals - 04/20/17 1203      PT SHORT TERM GOAL #1   Title  Patient will demonstrate functional recruitment of TA with breathing, sit-to-stand, squatting/lifting, and walking to allow for improved pelvic brace coordination, improved balance, and decreased downward pressure on the pelvic organs.    Time  5    Period  Weeks    Status  New    Target Date  05/25/17      PT SHORT TERM GOAL #2   Title  Patient will demonstrate improved sitting and standing posture to demonstrate learning and decrease  stress on the pelvic floor with functional activity.    Time  5    Period  Weeks    Status  New    Target Date  05/25/17      PT SHORT TERM GOAL #3   Title  Patient will describe feeling of pressure no more than 20% of the time over the course of the past week to demonstrate improved recruitment and strength of the pelvic floor.    Time  5    Period  Weeks    Status  New    Target Date  05/25/17        PT Long Term Goals - 04/20/17 1205      PT LONG TERM GOAL #1   Title  Patient will report no feeling of pelvic pressure/heviness lasting longer than 10 min. over prior week to demonstrate learning, miproved body  mechanics and posture, and ability to implement strategies to decrease downward pressure on POP.     Time  10    Period  Weeks    Status  New    Target Date  06/29/17      PT LONG TERM GOAL #2   Title  Patient will score at or below 35/300 on the PFDI and 0/300 on the PFIQ to demonstrate a clinically meaningful decrease in disability and distress due to pelvic floor dysfunction.    Baseline  Pelvic Floor Outcome Measures: PFDI: 80/300, PFIQ: 5/300    Time  10    Period  Weeks    Status  New    Target Date  06/29/17      PT LONG TERM GOAL #3   Title  Patient will report having BM's with consistency between [facility] stool scale 3-5 at least every-other day without need for stool-softener over the prior week to demonstrate decreased constipation.    Time  10    Period  Weeks    Status  New    Target Date  06/29/17      PT LONG TERM GOAL #4   Title  Patient will report no episodes of SUI over the course of the prior two weeks to demonstrate improved functional ability.    Time  10    Period  Weeks    Status  New    Target Date  06/29/17            Plan - 05/18/17 1145    Clinical Impression Statement  Patient demonstrated good strength and endurance of PFM today with improved recruitment following TP release from 4+/5 to nearly 5/5 for 10 seconds and can repeat 10 times. Her symptoms have also greatly improved and she now feels that it is much later in the day that she notices the prolapse and not every day as opposed to al the time originally. We will have one more session to re-assess goals and give final HEP and then hold D/C until after her long trip to determine if there are any issues with traveling that need to be considered or D/C at that time.     History and Personal Factors relevant to plan of care:  Will Be travelling to [facility] for two weeks following next appointment.    Clinical Presentation  Stable    Clinical Decision Making  Low    Rehab Potential  Excellent     Clinical Impairments Affecting Rehab Potential  Chronic Asthmatic bronchitis     PT Frequency  1x / week    PT Duration  Other (comment)    PT Treatment/Interventions  ADLs/Self Care Home Management;Functional mobility training;Stair training;Therapeutic activities;Therapeutic exercise;Balance training;Neuromuscular re-education;Manual techniques;Patient/family education;Dry needling    PT Next Visit Plan  LT goals, summarize to be ready for potential D/C.    PT Home Exercise Plan  squatty potty, lengthening/strengthening kegels, splinting, soda-can, squat-rows, MET for R anterior rotation, hip-flexor stretch, chin-tucks and thoracic extensions over towel roll.    Consulted and Agree with Plan of Care  Patient       Patient will benefit from skilled therapeutic intervention in order to improve the following deficits and impairments:  Improper body mechanics, Pain, Increased muscle spasms, Postural dysfunction, Decreased activity tolerance, Decreased strength, Difficulty walking  Visit Diagnosis: Abnormal posture  Other muscle spasm  Prolapse of vaginal wall  Constipation, unspecified constipation type     Problem List There are no active problems to display for this patient.  Willa Rough DPT, ATC Willa Rough 05/18/2017, 11:50 AM  [place] MAIN [facility] SERVICES [address] [place], [place], [address] Phone: [phone]   Fax:  [phone]  Name: Ruben Pyka MRN: [id number] Date of Birth: 1940-08-24

## 2017-05-19 DIAGNOSIS — H179 Unspecified corneal scar and opacity: Secondary | ICD-10-CM | POA: Diagnosis not present

## 2017-05-25 ENCOUNTER — Ambulatory Visit: Payer: PPO | Attending: Obstetrics and Gynecology

## 2017-05-25 DIAGNOSIS — N8110 Cystocele, unspecified: Secondary | ICD-10-CM | POA: Diagnosis not present

## 2017-05-25 DIAGNOSIS — K5900 Constipation, unspecified: Secondary | ICD-10-CM

## 2017-05-25 DIAGNOSIS — M62838 Other muscle spasm: Secondary | ICD-10-CM | POA: Diagnosis not present

## 2017-05-25 DIAGNOSIS — R293 Abnormal posture: Secondary | ICD-10-CM | POA: Insufficient documentation

## 2017-05-25 NOTE — Patient Instructions (Signed)
   The "I Love You" massage for your colon  Start by resting or lying quietly. 1. Using your fingertips, you apply light pressure in a stroking motion. 2. Start with your hands on the left hand side of your abdomen, below the rib cage, and stroke or make small circles down towards your left hip. This is the "I" of the "I Love You" massage. 3. Next, you are going to make the strokes in an upside down "L" shape. Run your fingertips from the right side of your upper abdomen, across under your ribs, and down the left side. 4. Now you are going to run through the whole path. This is the "U". Start on the bottom right of your abdomen. Stroke up the right side, across under the rib cage, and down the left side.   5. Finally, Using your fingertips, you apply light pressure in small circles  through the whole "U" path to "wake up" the smooth muscles of the intestines and get things moving.    Up the right, across under the rib cage, down the left and inwards, moving in a clockwise motion (if you are looking down upon your own abdomen)  Essentially you are massaging along the path of your large intestine. Our colon starts roughly in the bottom right of our abdomen, travels up the right hand side, turns and runs across below our rib cage, and then down the left side and in towards the pubic bone. When I teach this massage for people to do at home I have them start with 10 minutes. However, anecdotally, many people tell me 15-20 minutes really gets things going! After about 5 minutes of this massage my insides start gurgling and making noises. 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 A big problem is constipation resulting from either medication side effects, post surgical changes, or the fact that in general people in the hospital don't move as much (and exercise such as walking also helps regulate our digestive system). One of the first "exercises" I would teach them is how to do  the "I Love You" abdominal massage. Time and time again I have people come back to me and say that massaging their abdominal tissue helped their digestive issues. Give it a try today!  *Adapted from article written by Gwenlyn Perking, PT, DPT   *When you try to have a BM make sure that you are NOT squeezing the lower belly muscles. Try pursing the lips and then opening/relaxing them while imagining the anus relaxing simultaneously. Give yourself up to 10 minutes before you give up on having a BM. If you are going to push, ttry to isolate only the upper abdominals/obliques, but preferably do not use any if possible. Splint with thumb as needed for hard stool.

## 2017-05-25 NOTE — Therapy (Signed)
[place] MAIN [facility] SERVICES [address] [place], [place], [address] Phone: [phone]   Fax:  [phone]  Physical Therapy Treatment  Patient Details  Name: Amy Wells MRN: [id number] Date of Birth: [birth year] Referring Provider: Rolm Bookbinder   Encounter Date: 05/25/2017  PT End of Session - 05/25/17 1151    Visit Number  6    Number of Visits  10    Date for PT Re-Evaluation  06/29/17    PT Start Time  1791    PT Stop Time  1140    PT Time Calculation (min)  65 min    Activity Tolerance  Patient tolerated treatment well    Behavior During Therapy  [facility] for tasks assessed/performed       No past medical history on file.  Past Surgical History:  Procedure Laterality Date  . BREAST BIOPSY Left    neg  . BREAST CYST ASPIRATION Left    neg  . CATARACT EXTRACTION Right 2009  . TUBAL LIGATION  1972    There were no vitals filed for this visit.    Pelvic Floor Physical Therapy Treatment Note  SCREENING  Changes in medications, allergies, or medical history?: no   SUBJECTIVE  Patient reports: Is doing well, had one day where she felt that things were "dropped" some but overall she feels she is doing well. She has had to use mirilax once over prior week because she went 3 days without a BM. Most often is going regularly, gives up or tries to push after 3-5 min. Usually.   Pain update: none  Patient Goals: To reverse her prolapse as much as possible and learn how to live without it changing her lifestyle.    OBJECTIVE  Changes in:  Pelvic floor: Pt demonstrates TTP through coccygeus, IC, posteriorly and PR anteriorly, with quick resolution and greatest tenderness posteriorly.     INTERVENTIONS THIS SESSION: Manual: performed TP release B to coccygeus, IC, posteriorly and PR anteriorly to decrease spasms and improve ROM and recruitment of the PFM. Therex: reviewed HEP and added supine chin tucks and seated  pelvic tilts to continue to improve on strength and posture. Self-care: educated on not straining or using lower abdominal muscles to "push" BM's out as well as how to perform the "ILY" colonic massage to decrease constipation. Discussed POC moving forward and reviewed goals/updated.  Pelvic Floor Outcome Measures: PFDI:57/300, PFIQ: 5/300   Total time: 65 min.          No data recorded               PT Education - 05/25/17 1150    Education provided  Yes    Education Details  See Pt. Instructions and Interventions this session    Person(s) Educated  Patient    Methods  Explanation;Demonstration;Tactile cues;Verbal cues;Handout    Comprehension  Verbalized understanding;Returned demonstration       PT Short Term Goals - 05/25/17 1101      PT SHORT TERM GOAL #1   Title  Patient will demonstrate functional recruitment of TA with breathing, sit-to-stand, squatting/lifting, and walking to allow for improved pelvic brace coordination, improved balance, and decreased downward pressure on the pelvic organs.    Time  5    Period  Weeks    Status  Achieved    Target Date  05/25/17      PT SHORT TERM GOAL #2   Title  Patient will demonstrate improved sitting and standing  posture to demonstrate learning and decrease stress on the pelvic floor with functional activity.    Time  5    Period  Weeks    Status  New    Target Date  05/25/17      PT SHORT TERM GOAL #3   Title  Patient will describe feeling of pressure no more than 20% of the time over the course of the past week to demonstrate improved recruitment and strength of the pelvic floor.    Time  5    Period  Weeks    Status  Achieved    Target Date  05/25/17        PT Long Term Goals - 05/25/17 1056      PT LONG TERM GOAL #1   Title  Patient will report no feeling of pelvic pressure/heviness lasting longer than 10 min. over prior week to demonstrate learning, miproved body mechanics and posture, and  ability to implement strategies to decrease downward pressure on POP.     Time  10    Period  Weeks    Status  Partially Met    Target Date  06/29/17      PT LONG TERM GOAL #2   Title  Patient will score at or below 35/300 on the PFDI and 0/300 on the PFIQ to demonstrate a clinically meaningful decrease in disability and distress due to pelvic floor dysfunction.    Baseline  Pelvic Floor Outcome Measures: PFDI: 80/300, PFIQ: 5/300    Time  10    Period  Weeks    Status  Partially Met    Target Date  06/29/17      PT LONG TERM GOAL #3   Title  Patient will report having BM's with consistency between [facility] stool scale 3-5 at least every-other day without need for stool-softener over the prior week to demonstrate decreased constipation.    Time  10    Period  Weeks    Status  Partially Met    Target Date  06/29/17      PT LONG TERM GOAL #4   Title  Patient will report no episodes of SUI over the course of the prior two weeks to demonstrate improved functional ability.    Time  10    Period  Weeks    Status  Achieved    Target Date  06/29/17            Plan - 05/25/17 1152    Clinical Impression Statement  Pt. continues to have some PFM spasm and her constipation is still a moderate issue that is likely the reason she still has "bad-days" with prolapse though she has had no leakage and has made great improvements in body mechanics, posture, and PFM recruitment. We will continue for 2 more visits with ~2 weeks between to teach self TP release and contuinue to strengthen core/work on posture.     History and Personal Factors relevant to plan of care:  Is travelling to [facility] for 2 weeks.    Clinical Presentation  Stable    Clinical Decision Making  Low    Rehab Potential  Excellent    Clinical Impairments Affecting Rehab Potential  Chronic Asthmatic bronchitis     PT Frequency  1x / week    PT Duration  Other (comment)    PT Treatment/Interventions  ADLs/Self Care Home  Management;Functional mobility training;Stair training;Therapeutic activities;Therapeutic exercise;Balance training;Neuromuscular re-education;Manual techniques;Patient/family education;Dry needling    PT Next Visit Plan  re-check PFM and have Pt. order tool if not gone. discuss success with constipation management.    PT Home Exercise Plan  squatty potty, lengthening/strengthening kegels, splinting, soda-can, squat-rows, MET for R anterior rotation, hip-flexor stretch, chin-tucks and thoracic extensions over towel roll., pelvic tilts, supine chin-tucks    Consulted and Agree with Plan of Care  Patient       Patient will benefit from skilled therapeutic intervention in order to improve the following deficits and impairments:  Improper body mechanics, Pain, Increased muscle spasms, Postural dysfunction, Decreased activity tolerance, Decreased strength, Difficulty walking  Visit Diagnosis: Abnormal posture  Other muscle spasm  Prolapse of vaginal wall  Constipation, unspecified constipation type     Problem List There are no active problems to display for this patient.  Willa Rough DPT, ATC Willa Rough 05/25/2017, 11:57 AM  [place] MAIN [facility] SERVICES [address] [place], [place], [address] Phone: [phone]   Fax:  [phone]  Name: Amy Wells MRN: [id number] Date of Birth: 29-Apr-1940

## 2017-05-28 DIAGNOSIS — M7582 Other shoulder lesions, left shoulder: Secondary | ICD-10-CM | POA: Diagnosis not present

## 2017-06-01 ENCOUNTER — Ambulatory Visit: Payer: PPO

## 2017-06-08 ENCOUNTER — Ambulatory Visit: Payer: PPO

## 2017-06-15 ENCOUNTER — Ambulatory Visit: Payer: PPO

## 2017-06-15 DIAGNOSIS — K5900 Constipation, unspecified: Secondary | ICD-10-CM

## 2017-06-15 DIAGNOSIS — M62838 Other muscle spasm: Secondary | ICD-10-CM

## 2017-06-15 DIAGNOSIS — R293 Abnormal posture: Secondary | ICD-10-CM | POA: Diagnosis not present

## 2017-06-15 DIAGNOSIS — N8110 Cystocele, unspecified: Secondary | ICD-10-CM

## 2017-06-15 NOTE — Therapy (Signed)
[place] MAIN [facility] SERVICES [address] [place], [place], [address] Phone: [phone]   Fax:  [phone]  Physical Therapy Treatment  Patient Details  Name: Amy Wells MRN: [id number] Date of Birth: [birth year] Referring Provider: Rolm Bookbinder   Encounter Date: 06/15/2017  PT End of Session - 06/15/17 1138    Visit Number  7    Number of Visits  10    Date for PT Re-Evaluation  06/29/17    PT Start Time  1032    PT Stop Time  1132    PT Time Calculation (min)  60 min    Activity Tolerance  Patient tolerated treatment well    Behavior During Therapy  [facility] for tasks assessed/performed       No past medical history on file.  Past Surgical History:  Procedure Laterality Date  . BREAST BIOPSY Left    neg  . BREAST CYST ASPIRATION Left    neg  . CATARACT EXTRACTION Right 2009  . TUBAL LIGATION  1972    There were no vitals filed for this visit.    Pelvic Floor Physical Therapy Treatment Note  SCREENING  Changes in medications, allergies, or medical history?: no      SUBJECTIVE  Patient reports: Took prunes with her on the trip and this helped a lot, had to take stool softener one time only on trip. Was "slack" on most of her exercises but did not see any worsening of Sx.   Pain update: No pain  Patient Goals: To reverse her prolapse as much as possible and learn how to live without it changing her lifestyle.   OBJECTIVE  Changes in:  Pelvic floor: Pt. Able to demonstrate 4+/5 contraction with a 10 second hold. Demonstrates moderate TP's mostly through the PR both anteriorly and posteriorly, IC and coccygeus posteriorly which respond to TP release but have continued to recur though to a lesser extent.   INTERVENTIONS THIS SESSION: Manual: re-assessed PFM coordination, strength, and spasms and performed TP release to the PR both anteriorly and posteriorly, IC and coccygeus posteriorly for improved muscle  ROM and decreased spasm for less constipation/feeling of need to strain.  Self-care: reviewed ILY massage for colon and educated on a bowel retraining program to decrease constipation and straining. Educated on performance of self "thumb work" near the posterior fourchette and the need to/how to attain a tool so I can teach her how to perform self internal TP release for maintenance of PFM length and decreased constipation. NM re-ed: Educated on and practiced how to perform posterior pelvic tilt with pelvic brace to protect bladder from increased pressure with cough and sneeze.   Pelvic Floor Outcome Measures: PFDI: 25/300, PFIQ: 10/300                         PT Education - 06/15/17 1142    Education provided  Yes    Education Details  See Pt. Instructions and Intervntions this session    Person(s) Educated  Patient    Methods  Explanation;Demonstration;Verbal cues    Comprehension  Verbalized understanding;Returned demonstration;Verbal cues required       PT Short Term Goals - 05/25/17 1101      PT SHORT TERM GOAL #1   Title  Patient will demonstrate functional recruitment of TA with breathing, sit-to-stand, squatting/lifting, and walking to allow for improved pelvic brace coordination, improved balance, and decreased downward pressure on the pelvic organs.  Time  5    Period  Weeks    Status  Achieved    Target Date  05/25/17      PT SHORT TERM GOAL #2   Title  Patient will demonstrate improved sitting and standing posture to demonstrate learning and decrease stress on the pelvic floor with functional activity.    Time  5    Period  Weeks    Status  New    Target Date  05/25/17      PT SHORT TERM GOAL #3   Title  Patient will describe feeling of pressure no more than 20% of the time over the course of the past week to demonstrate improved recruitment and strength of the pelvic floor.    Time  5    Period  Weeks    Status  Achieved    Target Date   05/25/17        PT Long Term Goals - 06/15/17 1136      PT LONG TERM GOAL #1   Title  Patient will report no feeling of pelvic pressure/heviness lasting longer than 10 min. over prior week to demonstrate learning, miproved body mechanics and posture, and ability to implement strategies to decrease downward pressure on POP.     Time  10    Period  Weeks    Status  Partially Met    Target Date  06/29/17      PT LONG TERM GOAL #2   Title  Patient will score at or below 35/300 on the PFDI and 0/300 on the PFIQ to demonstrate a clinically meaningful decrease in disability and distress due to pelvic floor dysfunction.    Baseline  Pelvic Floor Outcome Measures: PFDI: 80/300, PFIQ: 5/300, PFDI: 25/300, PFIQ: 10/300 (Pt. is frustrated now that she is aware that constipation is also part of PF function.)    Time  10    Period  Weeks    Status  Partially Met    Target Date  06/29/17      PT LONG TERM GOAL #3   Title  Patient will report having BM's with consistency between [facility] stool scale 3-5 at least every-other day without need for stool-softener over the prior week to demonstrate decreased constipation.    Time  10    Period  Weeks    Status  Partially Met    Target Date  06/29/17      PT LONG TERM GOAL #4   Title  Patient will report no episodes of SUI over the course of the prior two weeks to demonstrate improved functional ability.    Time  10    Period  Weeks    Status  Achieved    Target Date  06/29/17            Plan - 06/15/17 1126    Clinical Impression Statement  Pt. continues to have some PFM tightness, especially posteriorly which is contributing to her having some contipation or needing to use prunes/stool softeners to stay regular.  She has not been consistent with any of her exercises other than kegels and chin-tucks over prior two weeks while travelling and has not has any worsening of Sx. Continue for 1 more visit to teach internal TP release and solidify  paintenence HEP.     Clinical Presentation  Stable    Clinical Decision Making  Low    Rehab Potential  Excellent    Clinical Impairments Affecting Rehab Potential  Chronic Asthmatic bronchitis  PT Frequency  1x / week    PT Duration  Other (comment)    PT Treatment/Interventions  ADLs/Self Care Home Management;Functional mobility training;Stair training;Therapeutic activities;Therapeutic exercise;Balance training;Neuromuscular re-education;Manual techniques;Patient/family education;Dry needling    PT Next Visit Plan  Teach internal TP release, review HEP/ discuss maintenence plan, D/C    PT Home Exercise Plan  squatty potty, lengthening/strengthening kegels, splinting, soda-can, squat-rows, MET for R anterior rotation, hip-flexor stretch, chin-tucks and thoracic extensions over towel roll., pelvic tilts, supine chin-tucks    Consulted and Agree with Plan of Care  Patient       Patient will benefit from skilled therapeutic intervention in order to improve the following deficits and impairments:  Improper body mechanics, Pain, Increased muscle spasms, Postural dysfunction, Decreased activity tolerance, Decreased strength, Difficulty walking  Visit Diagnosis: Abnormal posture  Other muscle spasm  Prolapse of vaginal wall  Constipation, unspecified constipation type     Problem List There are no active problems to display for this patient.  Willa Rough DPT, ATC Willa Rough 06/15/2017, 11:55 AM  [place] MAIN [facility] SERVICES [address] [place], [place], [address] Phone: [phone]   Fax:  [phone]  Name: Santiaga Butzin MRN: [id number] Date of Birth: [birth year]

## 2017-06-15 NOTE — Patient Instructions (Addendum)
  Female version: Bgee Classic (not plus!)   Female version: Dr. Kathline Magic Premium Prostate Massager     Bowel retraining program: 1) Start by drinking a hot (optionally caffeinated) beverage 2) Do your "I love you" colonic massage  3) Go for a short walk 4) Go to the toilet and sit with feet up on squatty potty and relaxing forward on your knees with tall spine. Take deep, lengthening, breaths and allow up to 10 minutes to have a BM without "straining" before you move on with the day.    "Pre-squeeze and sneeze"  Before you cough, sneeze, laugh etc. "pre-squeeze" the pelvic floor (kegel) and hold it until you finish coughing to retrain the muscles to hold the urine in during these activities.   * if you do a slight posterior pelvic tilt "tuck your tail" and draw in the lower tummy muscle just before sneezing or coughing it will help support the bladder and decrease likleihood of leakage or prolapse feeling.    "Thumb workProducer, television/film/video and prop your self up with pillows so you can reach the vaginal opening easily. Insert the tip of the thumb in and gently pressdown into the tight bands of tissue toward the anus. You can do sweeping motions side-to-side or forward and back and holds with deep breaths to help these muscles and the nerves that they put pressure on relax and allow for improved motion of the muscles and decreased constipation.

## 2017-06-16 ENCOUNTER — Ambulatory Visit
Admission: RE | Admit: 2017-06-16 | Discharge: 2017-06-16 | Disposition: A | Discharge status: Home / Self Care | Payer: PPO | Source: Ambulatory Visit | Attending: Internal Medicine | Admitting: Internal Medicine

## 2017-06-16 DIAGNOSIS — Z1231 Encounter for screening mammogram for malignant neoplasm of breast: Secondary | ICD-10-CM | POA: Diagnosis not present

## 2017-06-23 ENCOUNTER — Ambulatory Visit: Payer: PPO

## 2017-06-23 DIAGNOSIS — R293 Abnormal posture: Secondary | ICD-10-CM

## 2017-06-23 DIAGNOSIS — M62838 Other muscle spasm: Secondary | ICD-10-CM

## 2017-06-23 DIAGNOSIS — K5900 Constipation, unspecified: Secondary | ICD-10-CM

## 2017-06-23 DIAGNOSIS — N8110 Cystocele, unspecified: Secondary | ICD-10-CM

## 2017-06-23 NOTE — Patient Instructions (Signed)
      Come into half kneeling as described above, bring your hand down toward your opposite hip and then draw a diagonal line, rotating through the trunk.  Repeat _15x1__ times, _1__ times per day.  Self Internal Trigger Point Relief    1) Wash your hands and prop yourself up in a way where you can easily reach the vagina. You may wish to have a small hand-held mirror near by.  2) lubricate the tool and vaginal opening using a hypoallergenic lubricant such as "slippery-stuff".   3) Slowly and gently insert the tool into the vagina using deep breaths to allow relaxation of the muscles around the tool.  4) Avoiding the "12 o-clock" region near the urethra, gently use the handle of the tool like a lever to press the angled tip of the tool onto the wall of the pelvic floor.   5) Move the tool to different areas of the pelvic floor and feel for areas that are tender called "trigger points". When you find one hold the tool still, applying just enough pressure to elicit mild discomfort and take deep belly breaths until the discomfort subsides or decreases by at least 50%.   6) Repeat the process for any trigger points you find spending between 3-10 minutes on this per night until you do not find any more trigger points or you are told otherwise by your therapist..

## 2017-06-23 NOTE — Therapy (Signed)
[place] MAIN [facility] SERVICES [address] [place], [place], [address] Phone: [phone]   Fax:  [phone]  Physical Therapy Treatment and Discharge Summary  Patient Details  Name: Amy Wells MRN: [id number] Date of Birth: 04/30/1940 Referring Provider: Rolm Bookbinder   Encounter Date: 06/23/2017  PT End of Session - 06/23/17 1145    Visit Number  8    Number of Visits  10    Date for PT Re-Evaluation  06/29/17    PT Start Time  4492    PT Stop Time  1135    PT Time Calculation (min)  60 min    Activity Tolerance  Patient tolerated treatment well    Behavior During Therapy  [facility] for tasks assessed/performed       No past medical history on file.  Past Surgical History:  Procedure Laterality Date  . BREAST BIOPSY Left    neg  . BREAST CYST ASPIRATION Left    neg  . CATARACT EXTRACTION Right 2009  . TUBAL LIGATION  1972    There were no vitals filed for this visit.    Pelvic Floor Physical Therapy Treatment Note and Discharge Summary  SCREENING  Changes in medications, allergies, or medical history?: no   SUBJECTIVE  Patient reports: She feels good, she has been very active already today and has not felt increased pressure from the bladder. She has not had any leakage for weeks. She feels that she knows how to use her HEP to continue to improve and maintain improvement.   Pain update:  No pain  Patient Goals: To reverse her prolapse as much as possible and learn how to live without it changing her lifestyle.  OBJECTIVE  Changes in: Posture/Observations:  Pt. Demonstrates appropriate posture and body mechanics.  Range of Motion/Flexibilty:  Minimal sacral mobility prior to treatment today, moderate mobility following treatment. Referred pain into the hip and psoas with mobilization.  Pelvic floor: TPs present, demonstrated by patient during internal TP release training. Pt was able to decrease pain in 3  different TPs during session.  INTERVENTIONS THIS SESSION: Manual: Performed grade 3-4 PA mobs to sacrum for improved mobility to decrease neural tension and allow for appropriate accessory motion to allow relaxation of pelvic floor muscles so they can continue to become stronger and more supportive. Educated Pt. On self internal TP release to allow her to continue to improve in decreased constipation and to alow for improved length of PFM to allow greatest strengthening.  Pelvic Floor Outcome Measures: Filled out at previous session, Pt. Met goal for functional measure but did not improve on impact questionnaire due to "frustration" question because she will always be frustrated that she has to think about it.  Total time: 60 min.                         PT Education - 06/23/17 1144    Education provided  Yes    Education Details  See Pt Instructions and Interventions this session    Person(s) Educated  Patient    Methods  Explanation;Demonstration;Tactile cues;Verbal cues;Handout    Comprehension  Verbalized understanding;Returned demonstration;Verbal cues required       PT Short Term Goals - 05/25/17 1101      PT SHORT TERM GOAL #1   Title  Patient will demonstrate functional recruitment of TA with breathing, sit-to-stand, squatting/lifting, and walking to allow for improved pelvic brace coordination, improved balance, and decreased  downward pressure on the pelvic organs.    Time  5    Period  Weeks    Status  Achieved    Target Date  05/25/17      PT SHORT TERM GOAL #2   Title  Patient will demonstrate improved sitting and standing posture to demonstrate learning and decrease stress on the pelvic floor with functional activity.    Time  5    Period  Weeks    Status  New    Target Date  05/25/17      PT SHORT TERM GOAL #3   Title  Patient will describe feeling of pressure no more than 20% of the time over the course of the past week to demonstrate improved  recruitment and strength of the pelvic floor.    Time  5    Period  Weeks    Status  Achieved    Target Date  05/25/17        PT Long Term Goals - 06/23/17 1148      PT LONG TERM GOAL #1   Title  Patient will report no feeling of pelvic pressure/heviness lasting longer than 10 min. over prior week to demonstrate learning, miproved body mechanics and posture, and ability to implement strategies to decrease downward pressure on POP.     Time  10    Period  Weeks    Status  Partially Met    Target Date  06/29/17      PT LONG TERM GOAL #2   Title  Patient will score at or below 35/300 on the PFDI and 0/300 on the PFIQ to demonstrate a clinically meaningful decrease in disability and distress due to pelvic floor dysfunction.    Baseline  Pelvic Floor Outcome Measures: PFDI: 80/300, PFIQ: 5/300, PFDI: 25/300, PFIQ: 10/300 (Pt. is frustrated now that she is aware that constipation is also part of PF function.)    Time  10    Period  Weeks    Status  Partially Met    Target Date  06/29/17      PT LONG TERM GOAL #3   Title  Patient will report having BM's with consistency between [facility] stool scale 3-5 at least every-other day without need for stool-softener over the prior week to demonstrate decreased constipation.    Time  10    Period  Weeks    Status  Partially Met    Target Date  06/29/17      PT LONG TERM GOAL #4   Title  Patient will report no episodes of SUI over the course of the prior two weeks to demonstrate improved functional ability.    Time  10    Period  Weeks    Status  Achieved    Target Date  06/29/17            Plan - 06/23/17 1145    Clinical Impression Statement  Pt. has met or made progress toward all of her goals and feels confident that she has the tools necessary to continue to manage her prolapse and constipation on her own through use of her HEP and education provided. She demonstrated understanding of all principles discussed today and will email  if she needs simple guidance. Plan is to D/C at this time.     Clinical Presentation  Stable    Clinical Decision Making  Low    Rehab Potential  Excellent    Clinical Impairments Affecting Rehab Potential  Chronic Asthmatic  bronchitis     PT Frequency  1x / week    PT Duration  Other (comment) 10 weeks    PT Treatment/Interventions  ADLs/Self Care Home Management;Functional mobility training;Stair training;Therapeutic activities;Therapeutic exercise;Balance training;Neuromuscular re-education;Manual techniques;Patient/family education;Dry needling    PT Next Visit Plan  D/C    PT Home Exercise Plan  squatty potty, lengthening/strengthening kegels, splinting, soda-can, squat-rows, MET for R anterior rotation, hip-flexor stretch, chin-tucks and thoracic extensions over towel roll., pelvic tilts, supine chin-tucks, active psoas lengthening, self internal TP release.     Consulted and Agree with Plan of Care  Patient       Patient will benefit from skilled therapeutic intervention in order to improve the following deficits and impairments:  Improper body mechanics, Pain, Increased muscle spasms, Postural dysfunction, Decreased activity tolerance, Decreased strength, Difficulty walking  Visit Diagnosis: Abnormal posture  Other muscle spasm  Prolapse of vaginal wall  Constipation, unspecified constipation type     Problem List There are no active problems to display for this patient.  Willa Rough DPT, ATC Willa Rough 06/23/2017, 12:01 PM  [place] MAIN [facility] SERVICES [address] [place], [place], [address] Phone: [phone]   Fax:  [phone]  Name: Amy Wells MRN: [id number] Date of Birth: [birth year]

## 2017-07-06 DIAGNOSIS — M19012 Primary osteoarthritis, left shoulder: Secondary | ICD-10-CM | POA: Diagnosis not present

## 2017-07-06 DIAGNOSIS — M7582 Other shoulder lesions, left shoulder: Secondary | ICD-10-CM | POA: Diagnosis not present

## 2017-07-06 DIAGNOSIS — M7522 Bicipital tendinitis, left shoulder: Secondary | ICD-10-CM | POA: Diagnosis not present

## 2017-11-17 DIAGNOSIS — H2512 Age-related nuclear cataract, left eye: Secondary | ICD-10-CM | POA: Diagnosis not present

## 2017-11-17 DIAGNOSIS — Q141 Congenital malformation of retina: Secondary | ICD-10-CM | POA: Diagnosis not present

## 2017-12-15 DIAGNOSIS — L918 Other hypertrophic disorders of the skin: Secondary | ICD-10-CM | POA: Diagnosis not present

## 2017-12-15 DIAGNOSIS — D225 Melanocytic nevi of trunk: Secondary | ICD-10-CM | POA: Diagnosis not present

## 2017-12-15 DIAGNOSIS — Z1283 Encounter for screening for malignant neoplasm of skin: Secondary | ICD-10-CM | POA: Diagnosis not present

## 2017-12-15 DIAGNOSIS — L821 Other seborrheic keratosis: Secondary | ICD-10-CM | POA: Diagnosis not present

## 2017-12-15 DIAGNOSIS — L812 Freckles: Secondary | ICD-10-CM | POA: Diagnosis not present

## 2017-12-15 DIAGNOSIS — L219 Seborrheic dermatitis, unspecified: Secondary | ICD-10-CM | POA: Diagnosis not present

## 2017-12-15 DIAGNOSIS — D229 Melanocytic nevi, unspecified: Secondary | ICD-10-CM | POA: Diagnosis not present

## 2017-12-15 DIAGNOSIS — D1800 Hemangioma unspecified site: Secondary | ICD-10-CM | POA: Diagnosis not present

## 2017-12-30 DIAGNOSIS — E782 Mixed hyperlipidemia: Secondary | ICD-10-CM | POA: Diagnosis not present

## 2017-12-30 DIAGNOSIS — Z79899 Other long term (current) drug therapy: Secondary | ICD-10-CM | POA: Diagnosis not present

## 2017-12-30 DIAGNOSIS — E039 Hypothyroidism, unspecified: Secondary | ICD-10-CM | POA: Diagnosis not present

## 2018-01-06 DIAGNOSIS — E039 Hypothyroidism, unspecified: Secondary | ICD-10-CM | POA: Diagnosis not present

## 2018-01-06 DIAGNOSIS — Z0000 Encounter for general adult medical examination without abnormal findings: Secondary | ICD-10-CM | POA: Diagnosis not present

## 2018-01-06 DIAGNOSIS — E782 Mixed hyperlipidemia: Secondary | ICD-10-CM | POA: Diagnosis not present

## 2018-01-06 DIAGNOSIS — Z23 Encounter for immunization: Secondary | ICD-10-CM | POA: Diagnosis not present

## 2018-07-22 ENCOUNTER — Other Ambulatory Visit: Payer: Self-pay | Admitting: Internal Medicine

## 2018-07-22 DIAGNOSIS — Z1231 Encounter for screening mammogram for malignant neoplasm of breast: Secondary | ICD-10-CM

## 2018-09-13 DIAGNOSIS — B0052 Herpesviral keratitis: Secondary | ICD-10-CM | POA: Diagnosis not present

## 2018-09-25 DIAGNOSIS — Z20828 Contact with and (suspected) exposure to other viral communicable diseases: Secondary | ICD-10-CM | POA: Diagnosis not present

## 2018-09-27 DIAGNOSIS — H179 Unspecified corneal scar and opacity: Secondary | ICD-10-CM | POA: Diagnosis not present

## 2018-10-12 DIAGNOSIS — B0052 Herpesviral keratitis: Secondary | ICD-10-CM | POA: Diagnosis not present

## 2018-10-27 DIAGNOSIS — M9903 Segmental and somatic dysfunction of lumbar region: Secondary | ICD-10-CM | POA: Diagnosis not present

## 2018-10-27 DIAGNOSIS — M955 Acquired deformity of pelvis: Secondary | ICD-10-CM | POA: Diagnosis not present

## 2018-10-27 DIAGNOSIS — M9905 Segmental and somatic dysfunction of pelvic region: Secondary | ICD-10-CM | POA: Diagnosis not present

## 2018-10-27 DIAGNOSIS — M5416 Radiculopathy, lumbar region: Secondary | ICD-10-CM | POA: Diagnosis not present

## 2018-10-28 ENCOUNTER — Ambulatory Visit
Admission: RE | Admit: 2018-10-28 | Discharge: 2018-10-28 | Disposition: A | Discharge status: Home / Self Care | Payer: PPO | Source: Ambulatory Visit | Attending: Internal Medicine | Admitting: Internal Medicine

## 2018-10-28 DIAGNOSIS — Z1231 Encounter for screening mammogram for malignant neoplasm of breast: Secondary | ICD-10-CM | POA: Insufficient documentation

## 2018-10-29 DIAGNOSIS — M9905 Segmental and somatic dysfunction of pelvic region: Secondary | ICD-10-CM | POA: Diagnosis not present

## 2018-10-29 DIAGNOSIS — M9903 Segmental and somatic dysfunction of lumbar region: Secondary | ICD-10-CM | POA: Diagnosis not present

## 2018-10-29 DIAGNOSIS — M955 Acquired deformity of pelvis: Secondary | ICD-10-CM | POA: Diagnosis not present

## 2018-10-29 DIAGNOSIS — M5416 Radiculopathy, lumbar region: Secondary | ICD-10-CM | POA: Diagnosis not present

## 2018-11-05 DIAGNOSIS — Z86010 Personal history of colonic polyps: Secondary | ICD-10-CM | POA: Diagnosis not present

## 2018-11-10 DIAGNOSIS — M955 Acquired deformity of pelvis: Secondary | ICD-10-CM | POA: Diagnosis not present

## 2018-11-10 DIAGNOSIS — M5416 Radiculopathy, lumbar region: Secondary | ICD-10-CM | POA: Diagnosis not present

## 2018-11-10 DIAGNOSIS — B0052 Herpesviral keratitis: Secondary | ICD-10-CM | POA: Diagnosis not present

## 2018-11-10 DIAGNOSIS — M9903 Segmental and somatic dysfunction of lumbar region: Secondary | ICD-10-CM | POA: Diagnosis not present

## 2018-11-10 DIAGNOSIS — M9905 Segmental and somatic dysfunction of pelvic region: Secondary | ICD-10-CM | POA: Diagnosis not present

## 2018-11-17 DIAGNOSIS — M955 Acquired deformity of pelvis: Secondary | ICD-10-CM | POA: Diagnosis not present

## 2018-11-17 DIAGNOSIS — M5416 Radiculopathy, lumbar region: Secondary | ICD-10-CM | POA: Diagnosis not present

## 2018-11-17 DIAGNOSIS — M9903 Segmental and somatic dysfunction of lumbar region: Secondary | ICD-10-CM | POA: Diagnosis not present

## 2018-11-17 DIAGNOSIS — M9905 Segmental and somatic dysfunction of pelvic region: Secondary | ICD-10-CM | POA: Diagnosis not present

## 2018-11-23 DIAGNOSIS — M5416 Radiculopathy, lumbar region: Secondary | ICD-10-CM | POA: Diagnosis not present

## 2018-11-23 DIAGNOSIS — M9905 Segmental and somatic dysfunction of pelvic region: Secondary | ICD-10-CM | POA: Diagnosis not present

## 2018-11-23 DIAGNOSIS — M9903 Segmental and somatic dysfunction of lumbar region: Secondary | ICD-10-CM | POA: Diagnosis not present

## 2018-11-23 DIAGNOSIS — M955 Acquired deformity of pelvis: Secondary | ICD-10-CM | POA: Diagnosis not present

## 2018-11-29 DIAGNOSIS — B0052 Herpesviral keratitis: Secondary | ICD-10-CM | POA: Diagnosis not present

## 2018-11-30 DIAGNOSIS — M5416 Radiculopathy, lumbar region: Secondary | ICD-10-CM | POA: Diagnosis not present

## 2018-11-30 DIAGNOSIS — M955 Acquired deformity of pelvis: Secondary | ICD-10-CM | POA: Diagnosis not present

## 2018-11-30 DIAGNOSIS — M9903 Segmental and somatic dysfunction of lumbar region: Secondary | ICD-10-CM | POA: Diagnosis not present

## 2018-11-30 DIAGNOSIS — M9905 Segmental and somatic dysfunction of pelvic region: Secondary | ICD-10-CM | POA: Diagnosis not present

## 2018-12-01 DIAGNOSIS — B0052 Herpesviral keratitis: Secondary | ICD-10-CM | POA: Diagnosis not present

## 2018-12-06 DIAGNOSIS — B0052 Herpesviral keratitis: Secondary | ICD-10-CM | POA: Diagnosis not present

## 2018-12-13 DIAGNOSIS — B0052 Herpesviral keratitis: Secondary | ICD-10-CM | POA: Diagnosis not present

## 2018-12-20 DIAGNOSIS — L578 Other skin changes due to chronic exposure to nonionizing radiation: Secondary | ICD-10-CM | POA: Diagnosis not present

## 2018-12-20 DIAGNOSIS — L821 Other seborrheic keratosis: Secondary | ICD-10-CM | POA: Diagnosis not present

## 2018-12-20 DIAGNOSIS — L219 Seborrheic dermatitis, unspecified: Secondary | ICD-10-CM | POA: Diagnosis not present

## 2018-12-20 DIAGNOSIS — Z1283 Encounter for screening for malignant neoplasm of skin: Secondary | ICD-10-CM | POA: Diagnosis not present

## 2018-12-20 DIAGNOSIS — D225 Melanocytic nevi of trunk: Secondary | ICD-10-CM | POA: Diagnosis not present

## 2018-12-20 DIAGNOSIS — L814 Other melanin hyperpigmentation: Secondary | ICD-10-CM | POA: Diagnosis not present

## 2018-12-20 DIAGNOSIS — D1800 Hemangioma unspecified site: Secondary | ICD-10-CM | POA: Diagnosis not present

## 2018-12-21 DIAGNOSIS — M5416 Radiculopathy, lumbar region: Secondary | ICD-10-CM | POA: Diagnosis not present

## 2018-12-21 DIAGNOSIS — M955 Acquired deformity of pelvis: Secondary | ICD-10-CM | POA: Diagnosis not present

## 2018-12-21 DIAGNOSIS — M9905 Segmental and somatic dysfunction of pelvic region: Secondary | ICD-10-CM | POA: Diagnosis not present

## 2018-12-21 DIAGNOSIS — M9903 Segmental and somatic dysfunction of lumbar region: Secondary | ICD-10-CM | POA: Diagnosis not present

## 2019-01-06 DIAGNOSIS — E782 Mixed hyperlipidemia: Secondary | ICD-10-CM | POA: Diagnosis not present

## 2019-01-06 DIAGNOSIS — E039 Hypothyroidism, unspecified: Secondary | ICD-10-CM | POA: Diagnosis not present

## 2019-01-07 DIAGNOSIS — E039 Hypothyroidism, unspecified: Secondary | ICD-10-CM | POA: Diagnosis not present

## 2019-01-07 DIAGNOSIS — E782 Mixed hyperlipidemia: Secondary | ICD-10-CM | POA: Diagnosis not present

## 2019-01-11 DIAGNOSIS — M5416 Radiculopathy, lumbar region: Secondary | ICD-10-CM | POA: Diagnosis not present

## 2019-01-11 DIAGNOSIS — B0052 Herpesviral keratitis: Secondary | ICD-10-CM | POA: Diagnosis not present

## 2019-01-11 DIAGNOSIS — M955 Acquired deformity of pelvis: Secondary | ICD-10-CM | POA: Diagnosis not present

## 2019-01-11 DIAGNOSIS — M9905 Segmental and somatic dysfunction of pelvic region: Secondary | ICD-10-CM | POA: Diagnosis not present

## 2019-01-11 DIAGNOSIS — M9903 Segmental and somatic dysfunction of lumbar region: Secondary | ICD-10-CM | POA: Diagnosis not present

## 2019-01-13 DIAGNOSIS — Z23 Encounter for immunization: Secondary | ICD-10-CM | POA: Diagnosis not present

## 2019-01-13 DIAGNOSIS — J4520 Mild intermittent asthma, uncomplicated: Secondary | ICD-10-CM | POA: Diagnosis not present

## 2019-01-13 DIAGNOSIS — E782 Mixed hyperlipidemia: Secondary | ICD-10-CM | POA: Diagnosis not present

## 2019-01-13 DIAGNOSIS — Z0000 Encounter for general adult medical examination without abnormal findings: Secondary | ICD-10-CM | POA: Diagnosis not present

## 2019-01-14 IMAGING — MG MM DIGITAL SCREENING BILAT W/ TOMO W/ CAD
9 of 12 series · 9 of 28 positions shown · non-contrast
Comparison: Previous exam(s).

CLINICAL DATA: Screening.

EXAM:
2D DIGITAL SCREENING BILATERAL MAMMOGRAM WITH CAD AND ADJUNCT TOMO

[L CC synth-2D]
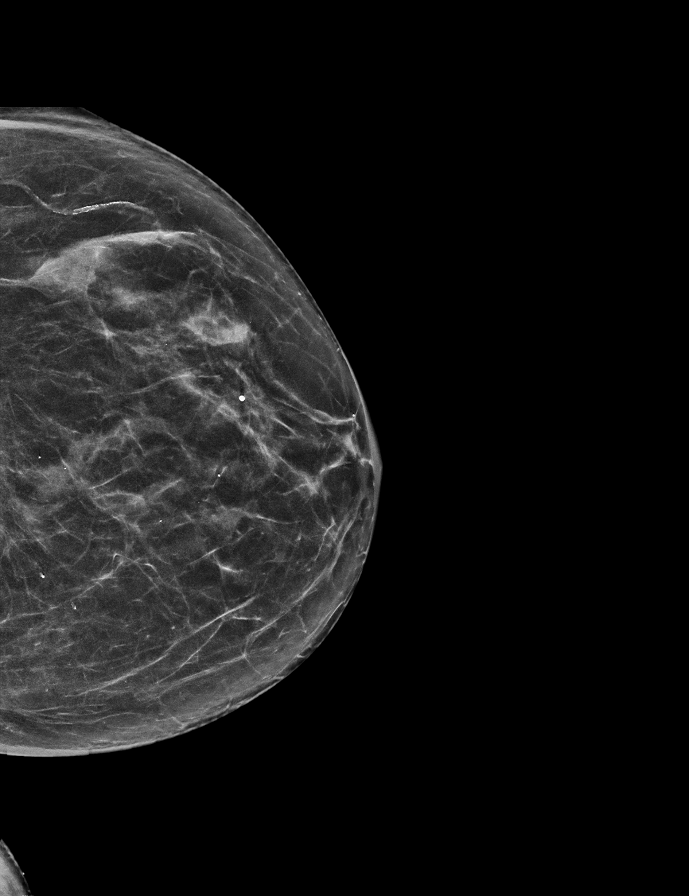

[L CC]
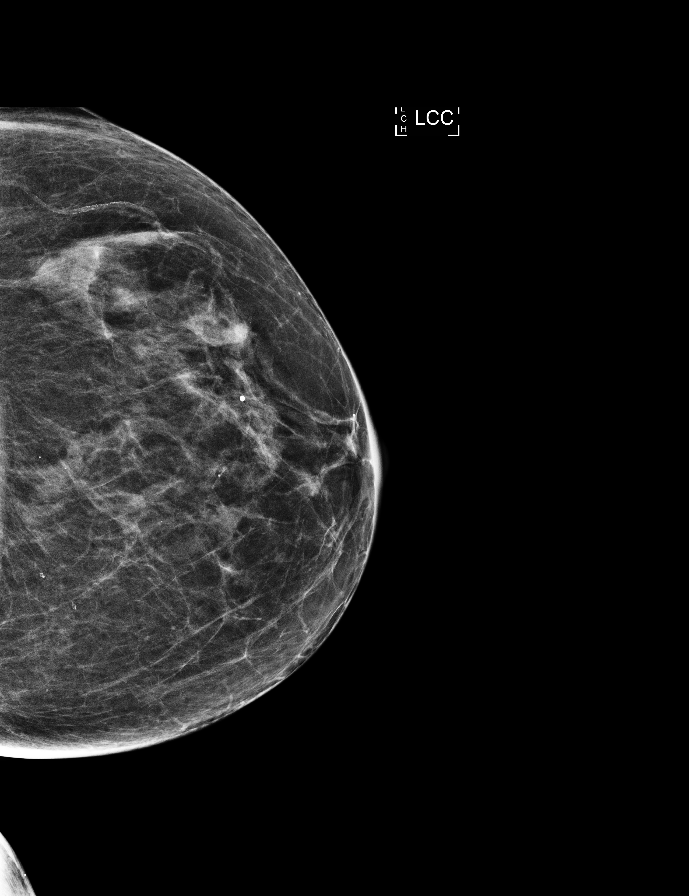

[L MLO synth-2D]
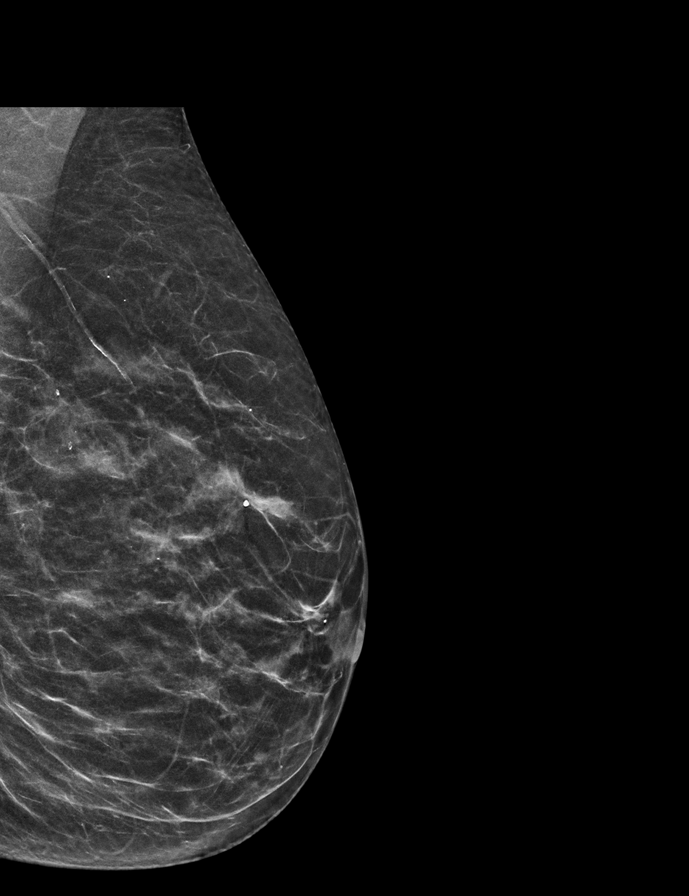

[L MLO]
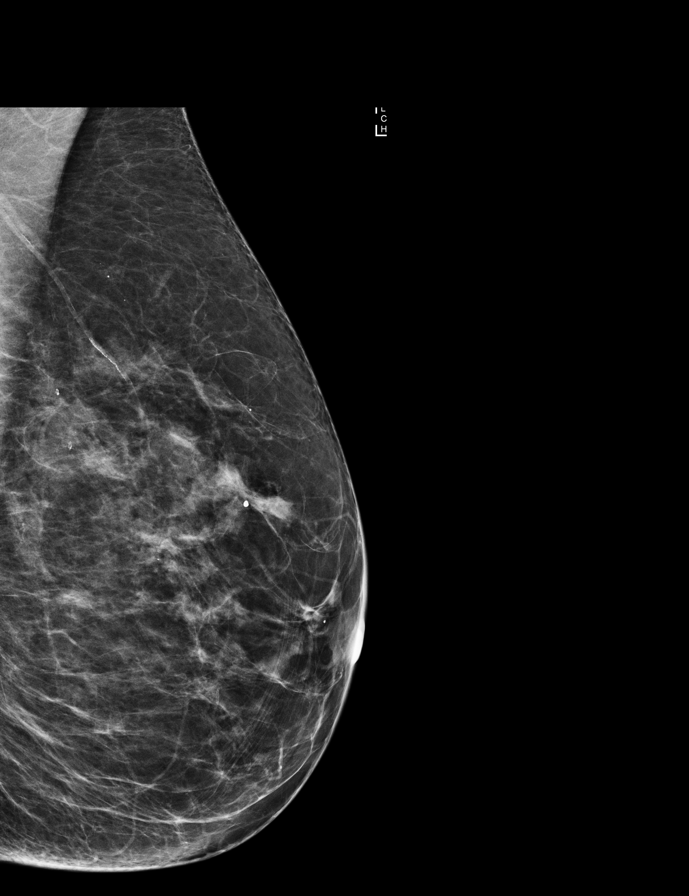

[R CC]
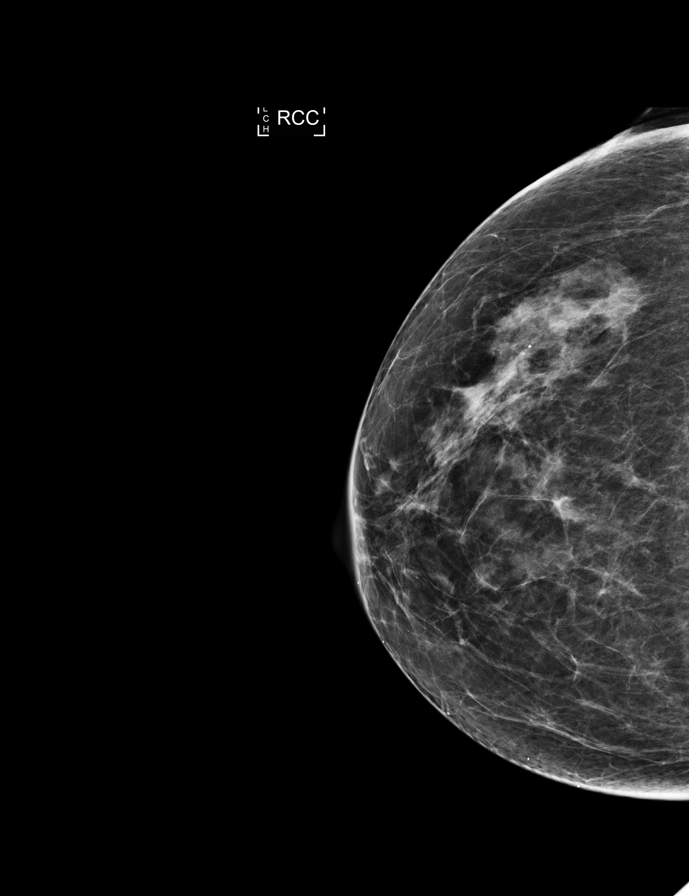

[R MLO]
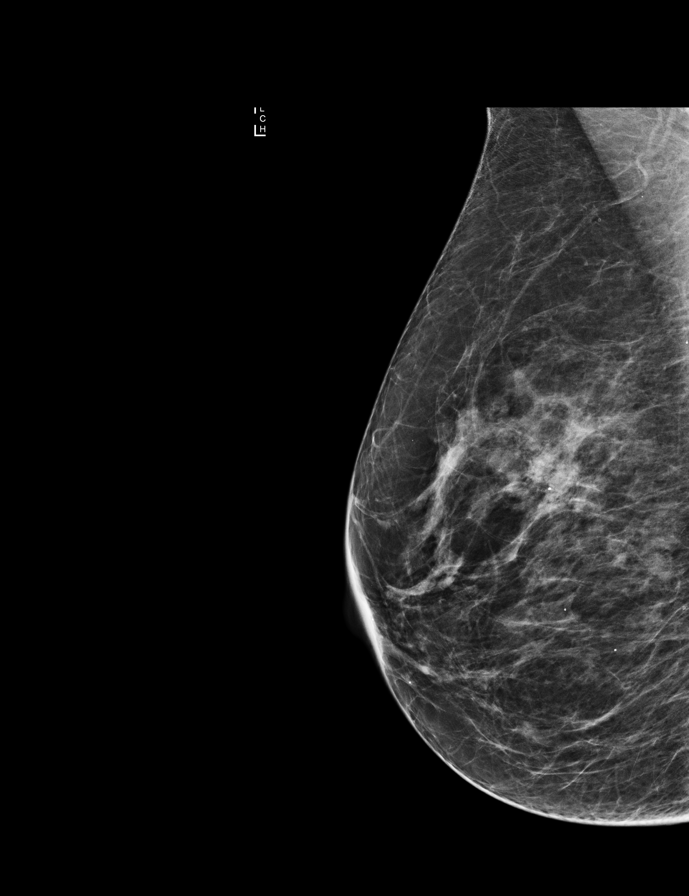

[R MLO synth-2D]
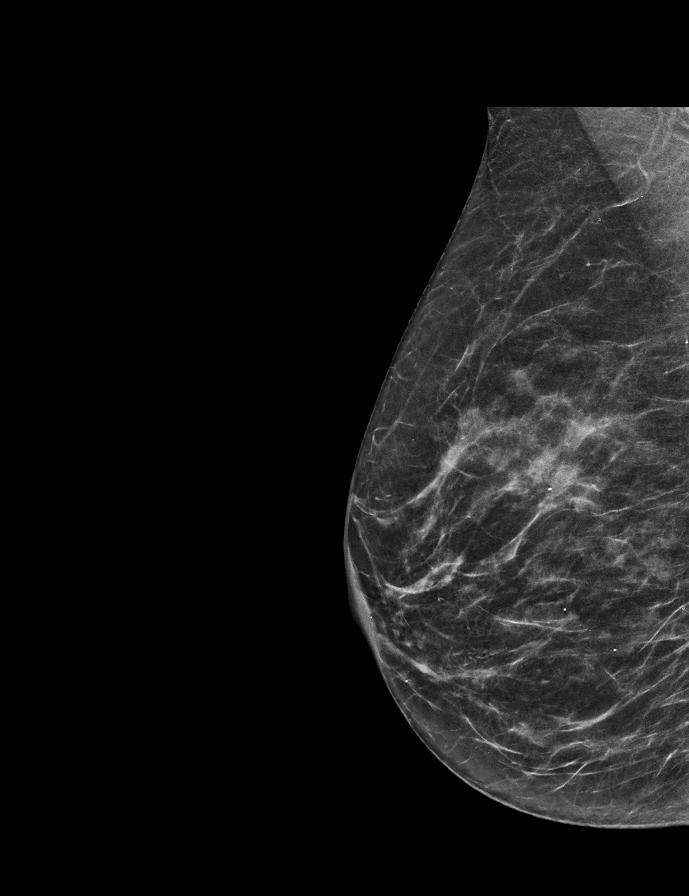

[R CC synth-2D]
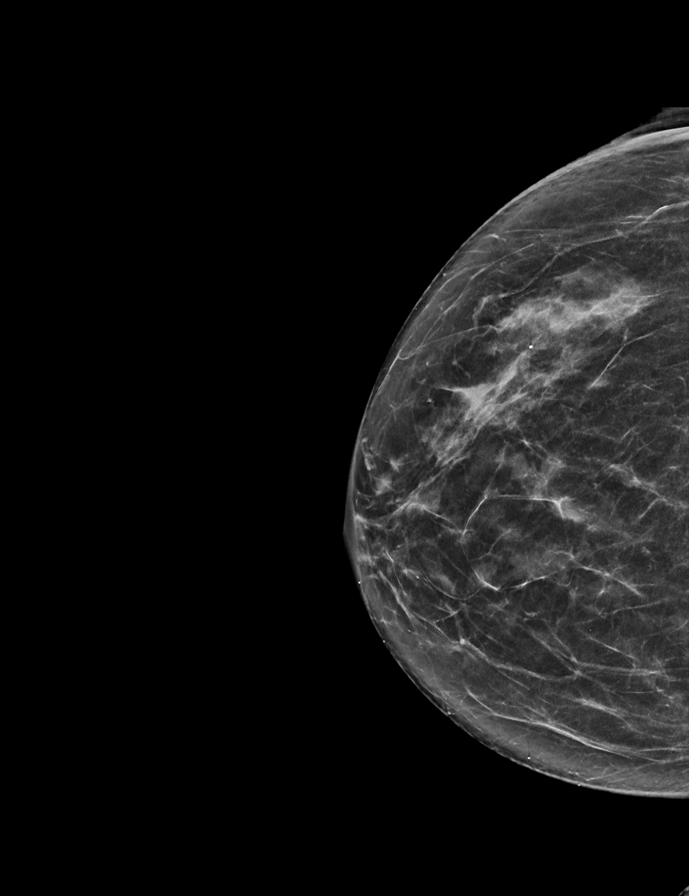

[L MLO tomo · tomo slice 23/45.0]
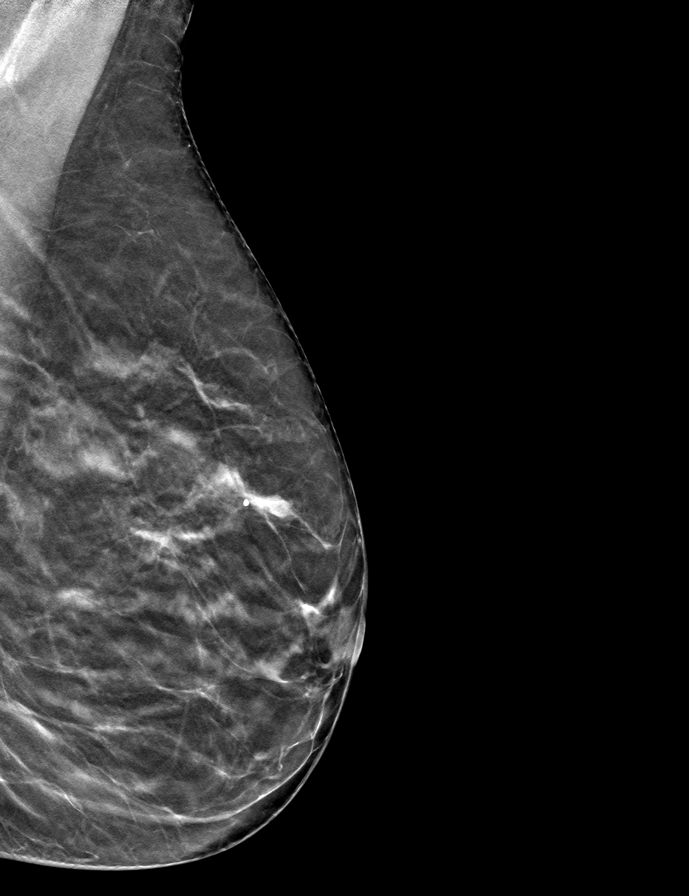

[9 of 28 positions shown; findings below may reference images not displayed]

ACR Breast Density Category b: There are scattered areas of
fibroglandular density.
FINDINGS: There are no findings suspicious for malignancy. Images were
processed with CAD.
IMPRESSION: No mammographic evidence of malignancy. A result letter of this
screening mammogram will be mailed directly to the patient.

RECOMMENDATION:
Screening mammogram in one year. (Code:97-6-RS4)

BI-RADS CATEGORY  1: Negative.

## 2019-01-18 DIAGNOSIS — Z4689 Encounter for fitting and adjustment of other specified devices: Secondary | ICD-10-CM | POA: Diagnosis not present

## 2019-01-18 DIAGNOSIS — N3941 Urge incontinence: Secondary | ICD-10-CM | POA: Diagnosis not present

## 2019-01-24 ENCOUNTER — Other Ambulatory Visit
Admission: RE | Admit: 2019-01-24 | Discharge: 2019-01-24 | Disposition: A | Discharge status: Home / Self Care | Payer: PPO | Source: Ambulatory Visit | Attending: Internal Medicine | Admitting: Internal Medicine

## 2019-01-24 ENCOUNTER — Other Ambulatory Visit: Payer: Self-pay

## 2019-01-24 DIAGNOSIS — Z20828 Contact with and (suspected) exposure to other viral communicable diseases: Secondary | ICD-10-CM | POA: Diagnosis not present

## 2019-01-24 DIAGNOSIS — Z01812 Encounter for preprocedural laboratory examination: Secondary | ICD-10-CM | POA: Insufficient documentation

## 2019-01-25 LAB — SARS CORONAVIRUS 2 (TAT 6-24 HRS): SARS Coronavirus 2: NEGATIVE

## 2019-01-26 ENCOUNTER — Encounter: Payer: Self-pay | Admitting: *Deleted

## 2019-01-27 ENCOUNTER — Encounter: Payer: Self-pay | Admitting: *Deleted

## 2019-01-27 ENCOUNTER — Other Ambulatory Visit: Payer: Self-pay

## 2019-01-27 ENCOUNTER — Ambulatory Visit: Payer: PPO | Admitting: Anesthesiology

## 2019-01-27 ENCOUNTER — Ambulatory Visit
Admission: RE | Admit: 2019-01-27 | Discharge: 2019-01-27 | Disposition: A | Discharge status: Home / Self Care | Payer: PPO | Attending: Internal Medicine | Admitting: Internal Medicine

## 2019-01-27 ENCOUNTER — Encounter
Admission: RE | Disposition: A | Discharge status: Home / Self Care | Payer: Self-pay | Source: Home / Self Care | Attending: Internal Medicine

## 2019-01-27 DIAGNOSIS — E039 Hypothyroidism, unspecified: Secondary | ICD-10-CM | POA: Diagnosis not present

## 2019-01-27 DIAGNOSIS — K648 Other hemorrhoids: Secondary | ICD-10-CM | POA: Diagnosis not present

## 2019-01-27 DIAGNOSIS — Z86010 Personal history of colonic polyps: Secondary | ICD-10-CM | POA: Insufficient documentation

## 2019-01-27 DIAGNOSIS — Z1211 Encounter for screening for malignant neoplasm of colon: Secondary | ICD-10-CM | POA: Diagnosis not present

## 2019-01-27 DIAGNOSIS — K5730 Diverticulosis of large intestine without perforation or abscess without bleeding: Secondary | ICD-10-CM | POA: Diagnosis not present

## 2019-01-27 DIAGNOSIS — K640 First degree hemorrhoids: Secondary | ICD-10-CM | POA: Insufficient documentation

## 2019-01-27 DIAGNOSIS — K5790 Diverticulosis of intestine, part unspecified, without perforation or abscess without bleeding: Secondary | ICD-10-CM | POA: Diagnosis not present

## 2019-01-27 DIAGNOSIS — Z7951 Long term (current) use of inhaled steroids: Secondary | ICD-10-CM | POA: Insufficient documentation

## 2019-01-27 DIAGNOSIS — Z79890 Hormone replacement therapy (postmenopausal): Secondary | ICD-10-CM | POA: Insufficient documentation

## 2019-01-27 DIAGNOSIS — Z79899 Other long term (current) drug therapy: Secondary | ICD-10-CM | POA: Insufficient documentation

## 2019-01-27 DIAGNOSIS — Z87891 Personal history of nicotine dependence: Secondary | ICD-10-CM | POA: Diagnosis not present

## 2019-01-27 DIAGNOSIS — Z885 Allergy status to narcotic agent status: Secondary | ICD-10-CM | POA: Diagnosis not present

## 2019-01-27 DIAGNOSIS — J45909 Unspecified asthma, uncomplicated: Secondary | ICD-10-CM | POA: Diagnosis not present

## 2019-01-27 HISTORY — PX: COLONOSCOPY WITH PROPOFOL: SHX5780

## 2019-01-27 HISTORY — DX: Unspecified asthma, uncomplicated: J45.909

## 2019-01-27 HISTORY — DX: Varicella without complication: B01.9

## 2019-01-27 HISTORY — DX: Herpesviral ocular disease, unspecified: B00.50

## 2019-01-27 HISTORY — DX: Hypothyroidism, unspecified: E03.9

## 2019-01-27 SURGERY — COLONOSCOPY WITH PROPOFOL
Anesthesia: General

## 2019-01-27 MED ORDER — PROPOFOL 10 MG/ML IV BOLUS
INTRAVENOUS | Status: DC | PRN
  Administered 2019-01-27: 40 mg via INTRAVENOUS

## 2019-01-27 MED ORDER — PROPOFOL 500 MG/50ML IV EMUL
INTRAVENOUS | Status: DC | PRN
  Administered 2019-01-27: 125 ug/kg/min via INTRAVENOUS

## 2019-01-27 MED ORDER — SODIUM CHLORIDE 0.9 % IV SOLN
INTRAVENOUS | Status: DC
  Administered 2019-01-27 (×2): via INTRAVENOUS

## 2019-01-27 NOTE — Interval H&P Note (Signed)
History and Physical Interval Note:  01/27/2019 9:15 AM  Amy Wells  has presented today for surgery, with the diagnosis of PERSONAL HX.OF COLON POLYPS.  The various methods of treatment have been discussed with the patient and family. After consideration of risks, benefits and other options for treatment, the patient has consented to  Procedure(s): COLONOSCOPY WITH PROPOFOL (N/A) as a surgical intervention.  The patient's history has been reviewed, patient examined, no change in status, stable for surgery.  I have reviewed the patient's chart and labs.  Questions were answered to the patient's satisfaction.     Bouse, Lyman

## 2019-01-27 NOTE — Anesthesia Post-op Follow-up Note (Signed)
Anesthesia QCDR form completed.        

## 2019-01-27 NOTE — Op Note (Signed)
[facility] Gastroenterology Patient Name: Amy Wells Procedure Date: 01/27/2019 10:09 AM MRN: [id number] Account #: [id number] Date of Birth: 08-Feb-1941 Admit Type: Outpatient Age: 78 Room: [facility] ENDO ROOM 2 Gender: Female Note Status: Finalized Procedure:             Colonoscopy Indications:           High risk colon cancer surveillance: Personal history                         of colonic polyps Providers:             Benay Pike. Alice Reichert MD, MD Referring MD:          Rusty Aus, MD (Referring MD) Medicines:             Propofol per Anesthesia Complications:         No immediate complications. Procedure:             Pre-Anesthesia Assessment:                        - The risks and benefits of the procedure and the                         sedation options and risks were discussed with the                         patient. All questions were answered and informed                         consent was obtained.                        - Patient identification and proposed procedure were                         verified prior to the procedure by the nurse. The                         procedure was verified in the procedure room.                        - ASA Grade Assessment: III - A patient with severe                         systemic disease.                        After obtaining informed consent, the colonoscope was                         passed under direct vision. Throughout the procedure,                         the patient's blood pressure, pulse, and oxygen                         saturations were monitored continuously. The                         Colonoscope was introduced through  the anus and                         advanced to the the cecum, identified by appendiceal                         orifice and ileocecal valve. The colonoscopy was                         performed without difficulty. The patient tolerated                         the procedure  well. The quality of the bowel                         preparation was good. Findings:      The perianal and digital rectal examinations were normal. Pertinent       negatives include normal sphincter tone and no palpable rectal lesions.      Non-bleeding internal hemorrhoids were found during retroflexion. The       hemorrhoids were Grade I (internal hemorrhoids that do not prolapse).      A few small-mouthed diverticula were found in the sigmoid colon. Impression:            - Non-bleeding internal hemorrhoids.                        - Diverticulosis in the sigmoid colon.                        - No specimens collected. Recommendation:        - Patient has a contact number available for                         emergencies. The signs and symptoms of potential                         delayed complications were discussed with the patient.                         Return to normal activities tomorrow. Written                         discharge instructions were provided to the patient.                        - Resume previous diet.                        - Continue present medications.                        - Await pathology results.                        - No repeat colonoscopy due to current age (2 years                         or older) and the absence of advanced adenomas. Procedure Code(s):     --- Professional ---  G0105, Colorectal cancer screening; colonoscopy on                         individual at high risk Diagnosis Code(s):     --- Professional ---                        K57.30, Diverticulosis of large intestine without                         perforation or abscess without bleeding                        K64.0, First degree hemorrhoids                        Z86.010, Personal history of colonic polyps CPT copyright 2019 American Medical Association. All rights reserved. The codes documented in this report are preliminary and upon coder review may  be  revised to meet current compliance requirements. Efrain Sella MD, MD 01/27/2019 10:36:26 AM This report has been signed electronically. Number of Addenda: 0 Note Initiated On: 01/27/2019 10:09 AM Scope Withdrawal Time: 0 hours 7 minutes 17 seconds  Total Procedure Duration: 0 hours 13 minutes 3 seconds  Estimated Blood Loss:  Estimated blood loss: none.      [facility]

## 2019-01-27 NOTE — Anesthesia Preprocedure Evaluation (Addendum)
Anesthesia Evaluation  Patient identified by MRN, date of birth, ID band Patient awake    Reviewed: Allergy & Precautions, H&P , NPO status , Patient's Chart, lab work & pertinent test results  History of Anesthesia Complications Negative for: history of anesthetic complications  Airway Mallampati: II  TM Distance: >3 FB     Dental  (+) Missing Upper incisor missing:   Pulmonary asthma , former smoker,           Cardiovascular negative cardio ROS       Neuro/Psych negative neurological ROS  negative psych ROS   GI/Hepatic negative GI ROS, Neg liver ROS,   Endo/Other  Hypothyroidism   Renal/GU negative Renal ROS  negative genitourinary   Musculoskeletal   Abdominal   Peds  Hematology negative hematology ROS (+)   Anesthesia Other Findings Past Medical History: No date: Asthma No date: Chicken pox 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  No date: Hypothyroidism  Past Surgical History: No date: BREAST CYST ASPIRATION; Left     Comment:  neg yrs ago: BREAST EXCISIONAL BIOPSY; Left     Comment:  neg 2009: CATARACT EXTRACTION; Right No date: COLONOSCOPY No date: FUNCTIONAL ENDOSCOPIC SINUS SURGERY 1972: TUBAL LIGATION  BMI    Body Mass Index: 20.83 kg/m      Reproductive/Obstetrics negative OB ROS                            Anesthesia Physical Anesthesia Plan  ASA: II  Anesthesia Plan: General   Post-op Pain Management:    Induction:   PONV Risk Score and Plan: Propofol infusion and TIVA  Airway Management Planned: Natural Airway and Nasal Cannula  Additional Equipment:   Intra-op Plan:   Post-operative Plan:   Informed Consent: I have reviewed the patients History and Physical, chart, labs and discussed the procedure including the risks, benefits and alternatives for the proposed anesthesia with the patient or authorized representative who has  indicated his/her understanding and acceptance.     Dental Advisory Given  Plan Discussed with: Anesthesiologist  Anesthesia Plan Comments:         Anesthesia Quick Evaluation

## 2019-01-27 NOTE — H&P (Signed)
Outpatient short stay form Pre-procedure 01/27/2019 9:14 AM Amy Wells K. Alice Reichert, M.D.  Primary Physician: Emily Filbert, M.D.  Reason for visit:  Personal hx of adenomatous colon polyp x 1 - Colonoscopy in 2015.  History of present illness:                            Patient presents for colonoscopy for a personal hx of colon polyps. The patient denies abdominal pain, abnormal weight loss or rectal bleeding.     No current facility-administered medications for this encounter.   Medications Prior to Admission  Medication Sig Dispense Refill Last Dose  . amoxicillin (AMOXIL) 500 MG capsule Take 500 mg by mouth 3 (three) times daily.   Past Week at Unknown time  . B Complex Vitamins (VITAMIN B COMPLEX PO) Take by mouth daily.   Past Week at Unknown time  . cetirizine (ZYRTEC) 10 MG tablet Take 10 mg by mouth daily.   Past Week at Unknown time  . CHOLECALCIFEROL PO Take 1,000 Units by mouth daily.   Past Week at Unknown time  . Fluocinolone Acetonide (DERMOTIC) 0.01 % OIL Place in ear(s) daily. prn   01/27/2019 at 0700  . Fluticasone-Salmeterol (ADVAIR) 100-50 MCG/DOSE AEPB Inhale 1 puff into the lungs 2 (two) times daily.   Past Week at Unknown time  . guaiFENesin (MUCINEX) 600 MG 12 hr tablet Take by mouth 2 (two) times daily.   Past Week at Unknown time  . ibuprofen (ADVIL,MOTRIN) 200 MG tablet Take 200 mg by mouth every 6 (six) hours as needed.   Past Week at Unknown time  . levocetirizine (XYZAL) 5 MG tablet Take 5 mg by mouth every evening.   Past Month at Unknown time  . levothyroxine (SYNTHROID, LEVOTHROID) 50 MCG tablet Take 50 mcg by mouth daily before breakfast.   01/26/2019 at 8000  . montelukast (SINGULAIR) 10 MG tablet Take 10 mg by mouth at bedtime.   Past Week at Unknown time  . naproxen sodium (ALEVE) 220 MG tablet Take 220 mg by mouth daily as needed.   Past Week at Unknown time  . prednisoLONE acetate (PRED MILD) 0.12 % ophthalmic suspension Place 1 drop into the right eye once.    01/26/2019 at Unknown time  . TURMERIC PO Take by mouth.   Past Week at Unknown time  . Vitamin Mixture (VITAMIN E COMPLETE PO) Take by mouth daily.   Past Week at Unknown time  . Azelastine-Fluticasone (DYMISTA) 137-50 MCG/ACT SUSP Place into the nose daily.   Not Taking at Unknown time     Allergies  Allergen Reactions  . Demadex [Torsemide]   . Codeine Anxiety     Past Medical History:  Diagnosis Date  . Asthma   . Chicken pox   . Herpes simplex of eye    right eye   . Hypothyroidism     Review of systems:  Otherwise negative.    Physical Exam  Gen: Alert, oriented. Appears stated age.  HEENT: [place]/AT. PERRLA. Lungs: CTA, no wheezes. CV: RR nl S1, S2. Abd: soft, benign, no masses. BS+ Ext: No edema. Pulses 2+    Planned procedures: Proceed with colonoscopy. The patient understands the nature of the planned procedure, indications, risks, alternatives and potential complications including but not limited to bleeding, infection, perforation, damage to internal organs and possible oversedation/side effects from anesthesia. The patient agrees and gives consent to proceed.  Please refer to procedure notes for findings,  recommendations and patient disposition/instructions.     Aracelie Addis K. Alice Reichert, M.D. Gastroenterology 01/27/2019  9:14 AM

## 2019-01-27 NOTE — Transfer of Care (Signed)
Immediate Anesthesia Transfer of Care Note  Patient: Amy Wells  Procedure(s) Performed: COLONOSCOPY WITH PROPOFOL (N/A )  Patient Location: PACU  Anesthesia Type:General  Level of Consciousness: awake and alert   Airway & Oxygen Therapy: Patient Spontanous Breathing  Post-op Assessment: Report given to RN  Post vital signs: Reviewed and stable  Last Vitals:  Vitals Value Taken Time  BP    Temp    Pulse 75 01/27/19 1037  Resp 17 01/27/19 1037  SpO2 97 % 01/27/19 1037  Vitals shown include unvalidated device data.  Last Pain:  Vitals:   01/27/19 1033  TempSrc:   PainSc: Asleep         Complications: No apparent anesthesia complications

## 2019-01-28 ENCOUNTER — Encounter: Payer: Self-pay | Admitting: Internal Medicine

## 2019-01-28 NOTE — Anesthesia Postprocedure Evaluation (Signed)
Anesthesia Post Note  Patient: Amy Wells  Procedure(s) Performed: COLONOSCOPY WITH PROPOFOL (N/A )  Patient location during evaluation: PACU Anesthesia Type: General Level of consciousness: awake and alert Pain management: pain level controlled Vital Signs Assessment: post-procedure vital signs reviewed and stable Respiratory status: spontaneous breathing, nonlabored ventilation and respiratory function stable Cardiovascular status: blood pressure returned to baseline and stable Postop Assessment: no apparent nausea or vomiting Anesthetic complications: no     Last Vitals:  Vitals:   01/27/19 1043 01/27/19 1053  BP: (!) 104/51 (!) 115/56  Pulse: 75 65  Resp: 17 20  Temp:    SpO2: 100% 100%    Last Pain:  Vitals:   01/28/19 0741  TempSrc:   PainSc: 0-No pain                 Durenda Hurt

## 2019-02-23 DIAGNOSIS — M9903 Segmental and somatic dysfunction of lumbar region: Secondary | ICD-10-CM | POA: Diagnosis not present

## 2019-02-23 DIAGNOSIS — M955 Acquired deformity of pelvis: Secondary | ICD-10-CM | POA: Diagnosis not present

## 2019-02-23 DIAGNOSIS — M9905 Segmental and somatic dysfunction of pelvic region: Secondary | ICD-10-CM | POA: Diagnosis not present

## 2019-02-23 DIAGNOSIS — M5416 Radiculopathy, lumbar region: Secondary | ICD-10-CM | POA: Diagnosis not present

## 2019-02-28 DIAGNOSIS — N3946 Mixed incontinence: Secondary | ICD-10-CM | POA: Diagnosis not present

## 2019-02-28 DIAGNOSIS — N812 Incomplete uterovaginal prolapse: Secondary | ICD-10-CM | POA: Diagnosis not present

## 2019-02-28 DIAGNOSIS — N952 Postmenopausal atrophic vaginitis: Secondary | ICD-10-CM | POA: Diagnosis not present

## 2019-02-28 DIAGNOSIS — N8111 Cystocele, midline: Secondary | ICD-10-CM | POA: Diagnosis not present

## 2019-03-12 ENCOUNTER — Ambulatory Visit: Payer: Medicare Other | Attending: Internal Medicine

## 2019-03-12 DIAGNOSIS — Z23 Encounter for immunization: Secondary | ICD-10-CM | POA: Insufficient documentation

## 2019-03-12 NOTE — Progress Notes (Signed)
   Covid-19 Vaccination Clinic  Name:  Amy Wells    MRN: [id number] DOB: [birth year]  03/12/2019  [name] was observed post Covid-19 immunization for 15 minutes without incidence. She was provided with Vaccine Information Sheet and instruction to access the V-Safe system.   [name] was instructed to call 911 with any severe reactions post vaccine: Marland Kitchen Difficulty breathing  . Swelling of your face and throat  . A fast heartbeat  . A bad rash all over your body  . Dizziness and weakness    Immunizations Administered    Name Date Dose VIS Date Route   Pfizer COVID-19 Vaccine 03/12/2019 11:19 AM 0.3 mL 02/04/2019 Intramuscular   Manufacturer: [employer], [employer]   Lot: [id number]   [place]: [id number]

## 2019-03-24 DIAGNOSIS — B0052 Herpesviral keratitis: Secondary | ICD-10-CM | POA: Diagnosis not present

## 2019-03-30 DIAGNOSIS — B0052 Herpesviral keratitis: Secondary | ICD-10-CM | POA: Diagnosis not present

## 2019-04-02 ENCOUNTER — Ambulatory Visit: Payer: Medicare Other | Attending: Internal Medicine

## 2019-04-02 DIAGNOSIS — Z23 Encounter for immunization: Secondary | ICD-10-CM | POA: Insufficient documentation

## 2019-04-02 NOTE — Progress Notes (Signed)
   Covid-19 Vaccination Clinic  Name:  Rediet Zittel    MRN: [id number] DOB: 05/02/1940  04/02/2019  Ms. Haider was observed post Covid-19 immunization for 15 minutes without incidence. She was provided with Vaccine Information Sheet and instruction to access the V-Safe system.   Ms. Kostrzewa was instructed to call 911 with any severe reactions post vaccine: Marland Kitchen Difficulty breathing  . Swelling of your face and throat  . A fast heartbeat  . A bad rash all over your body  . Dizziness and weakness    Immunizations Administered    Name Date Dose VIS Date Route   Pfizer COVID-19 Vaccine 04/02/2019 11:25 AM 0.3 mL 02/04/2019 Intramuscular   Manufacturer: [employer]   Lot: [id number]   [place]: [id number]

## 2019-04-13 DIAGNOSIS — B0052 Herpesviral keratitis: Secondary | ICD-10-CM | POA: Diagnosis not present

## 2019-04-18 DIAGNOSIS — B0052 Herpesviral keratitis: Secondary | ICD-10-CM | POA: Diagnosis not present

## 2019-04-25 DIAGNOSIS — B0052 Herpesviral keratitis: Secondary | ICD-10-CM | POA: Diagnosis not present

## 2019-04-27 DIAGNOSIS — B0052 Herpesviral keratitis: Secondary | ICD-10-CM | POA: Diagnosis not present

## 2019-05-11 DIAGNOSIS — B0052 Herpesviral keratitis: Secondary | ICD-10-CM | POA: Diagnosis not present

## 2019-06-01 DIAGNOSIS — B0052 Herpesviral keratitis: Secondary | ICD-10-CM | POA: Diagnosis not present

## 2019-06-29 DIAGNOSIS — M955 Acquired deformity of pelvis: Secondary | ICD-10-CM | POA: Diagnosis not present

## 2019-06-29 DIAGNOSIS — M5416 Radiculopathy, lumbar region: Secondary | ICD-10-CM | POA: Diagnosis not present

## 2019-06-29 DIAGNOSIS — M9903 Segmental and somatic dysfunction of lumbar region: Secondary | ICD-10-CM | POA: Diagnosis not present

## 2019-06-29 DIAGNOSIS — M9905 Segmental and somatic dysfunction of pelvic region: Secondary | ICD-10-CM | POA: Diagnosis not present

## 2019-07-01 DIAGNOSIS — M5416 Radiculopathy, lumbar region: Secondary | ICD-10-CM | POA: Diagnosis not present

## 2019-07-01 DIAGNOSIS — M955 Acquired deformity of pelvis: Secondary | ICD-10-CM | POA: Diagnosis not present

## 2019-07-01 DIAGNOSIS — M9905 Segmental and somatic dysfunction of pelvic region: Secondary | ICD-10-CM | POA: Diagnosis not present

## 2019-07-01 DIAGNOSIS — M9903 Segmental and somatic dysfunction of lumbar region: Secondary | ICD-10-CM | POA: Diagnosis not present

## 2019-07-12 DIAGNOSIS — M5416 Radiculopathy, lumbar region: Secondary | ICD-10-CM | POA: Diagnosis not present

## 2019-07-12 DIAGNOSIS — M955 Acquired deformity of pelvis: Secondary | ICD-10-CM | POA: Diagnosis not present

## 2019-07-12 DIAGNOSIS — B0052 Herpesviral keratitis: Secondary | ICD-10-CM | POA: Diagnosis not present

## 2019-07-12 DIAGNOSIS — M9905 Segmental and somatic dysfunction of pelvic region: Secondary | ICD-10-CM | POA: Diagnosis not present

## 2019-07-12 DIAGNOSIS — M9903 Segmental and somatic dysfunction of lumbar region: Secondary | ICD-10-CM | POA: Diagnosis not present

## 2019-07-19 DIAGNOSIS — M955 Acquired deformity of pelvis: Secondary | ICD-10-CM | POA: Diagnosis not present

## 2019-07-19 DIAGNOSIS — M9905 Segmental and somatic dysfunction of pelvic region: Secondary | ICD-10-CM | POA: Diagnosis not present

## 2019-07-19 DIAGNOSIS — M9903 Segmental and somatic dysfunction of lumbar region: Secondary | ICD-10-CM | POA: Diagnosis not present

## 2019-07-19 DIAGNOSIS — M5416 Radiculopathy, lumbar region: Secondary | ICD-10-CM | POA: Diagnosis not present

## 2019-07-26 DIAGNOSIS — M955 Acquired deformity of pelvis: Secondary | ICD-10-CM | POA: Diagnosis not present

## 2019-07-26 DIAGNOSIS — M5416 Radiculopathy, lumbar region: Secondary | ICD-10-CM | POA: Diagnosis not present

## 2019-07-26 DIAGNOSIS — M9905 Segmental and somatic dysfunction of pelvic region: Secondary | ICD-10-CM | POA: Diagnosis not present

## 2019-07-26 DIAGNOSIS — M9903 Segmental and somatic dysfunction of lumbar region: Secondary | ICD-10-CM | POA: Diagnosis not present

## 2019-08-09 DIAGNOSIS — M5416 Radiculopathy, lumbar region: Secondary | ICD-10-CM | POA: Diagnosis not present

## 2019-08-09 DIAGNOSIS — M9905 Segmental and somatic dysfunction of pelvic region: Secondary | ICD-10-CM | POA: Diagnosis not present

## 2019-08-09 DIAGNOSIS — M9903 Segmental and somatic dysfunction of lumbar region: Secondary | ICD-10-CM | POA: Diagnosis not present

## 2019-08-09 DIAGNOSIS — M955 Acquired deformity of pelvis: Secondary | ICD-10-CM | POA: Diagnosis not present

## 2019-08-22 DIAGNOSIS — B0052 Herpesviral keratitis: Secondary | ICD-10-CM | POA: Diagnosis not present

## 2019-08-30 DIAGNOSIS — B0052 Herpesviral keratitis: Secondary | ICD-10-CM | POA: Diagnosis not present

## 2019-08-30 DIAGNOSIS — M5416 Radiculopathy, lumbar region: Secondary | ICD-10-CM | POA: Diagnosis not present

## 2019-08-30 DIAGNOSIS — M955 Acquired deformity of pelvis: Secondary | ICD-10-CM | POA: Diagnosis not present

## 2019-08-30 DIAGNOSIS — M9903 Segmental and somatic dysfunction of lumbar region: Secondary | ICD-10-CM | POA: Diagnosis not present

## 2019-08-30 DIAGNOSIS — M9905 Segmental and somatic dysfunction of pelvic region: Secondary | ICD-10-CM | POA: Diagnosis not present

## 2019-09-01 DIAGNOSIS — N814 Uterovaginal prolapse, unspecified: Secondary | ICD-10-CM | POA: Diagnosis not present

## 2019-09-01 DIAGNOSIS — N393 Stress incontinence (female) (male): Secondary | ICD-10-CM | POA: Diagnosis not present

## 2019-09-01 DIAGNOSIS — Z01818 Encounter for other preprocedural examination: Secondary | ICD-10-CM | POA: Diagnosis not present

## 2019-09-05 DIAGNOSIS — J9811 Atelectasis: Secondary | ICD-10-CM | POA: Diagnosis not present

## 2019-09-05 DIAGNOSIS — J40 Bronchitis, not specified as acute or chronic: Secondary | ICD-10-CM | POA: Diagnosis not present

## 2019-09-05 DIAGNOSIS — J45902 Unspecified asthma with status asthmaticus: Secondary | ICD-10-CM | POA: Diagnosis not present

## 2019-09-07 DIAGNOSIS — B0052 Herpesviral keratitis: Secondary | ICD-10-CM | POA: Diagnosis not present

## 2019-09-12 DIAGNOSIS — J45909 Unspecified asthma, uncomplicated: Secondary | ICD-10-CM | POA: Diagnosis not present

## 2019-09-12 DIAGNOSIS — E039 Hypothyroidism, unspecified: Secondary | ICD-10-CM | POA: Diagnosis not present

## 2019-09-12 DIAGNOSIS — D251 Intramural leiomyoma of uterus: Secondary | ICD-10-CM | POA: Diagnosis not present

## 2019-09-12 DIAGNOSIS — N736 Female pelvic peritoneal adhesions (postinfective): Secondary | ICD-10-CM | POA: Diagnosis not present

## 2019-09-12 DIAGNOSIS — Z79899 Other long term (current) drug therapy: Secondary | ICD-10-CM | POA: Diagnosis not present

## 2019-09-12 DIAGNOSIS — N393 Stress incontinence (female) (male): Secondary | ICD-10-CM | POA: Diagnosis not present

## 2019-09-12 DIAGNOSIS — Z20822 Contact with and (suspected) exposure to covid-19: Secondary | ICD-10-CM | POA: Diagnosis not present

## 2019-09-12 DIAGNOSIS — M19012 Primary osteoarthritis, left shoulder: Secondary | ICD-10-CM | POA: Diagnosis not present

## 2019-09-12 DIAGNOSIS — E782 Mixed hyperlipidemia: Secondary | ICD-10-CM | POA: Diagnosis not present

## 2019-09-12 DIAGNOSIS — Z791 Long term (current) use of non-steroidal anti-inflammatories (NSAID): Secondary | ICD-10-CM | POA: Diagnosis not present

## 2019-09-12 DIAGNOSIS — Z87891 Personal history of nicotine dependence: Secondary | ICD-10-CM | POA: Diagnosis not present

## 2019-09-12 DIAGNOSIS — N813 Complete uterovaginal prolapse: Secondary | ICD-10-CM | POA: Diagnosis not present

## 2019-09-12 DIAGNOSIS — D219 Benign neoplasm of connective and other soft tissue, unspecified: Secondary | ICD-10-CM | POA: Diagnosis not present

## 2019-09-29 DIAGNOSIS — R5383 Other fatigue: Secondary | ICD-10-CM | POA: Diagnosis not present

## 2019-09-29 DIAGNOSIS — N8110 Cystocele, unspecified: Secondary | ICD-10-CM | POA: Diagnosis not present

## 2019-10-17 ENCOUNTER — Other Ambulatory Visit: Payer: Self-pay | Admitting: Internal Medicine

## 2019-10-17 DIAGNOSIS — B0052 Herpesviral keratitis: Secondary | ICD-10-CM | POA: Diagnosis not present

## 2019-10-17 DIAGNOSIS — Z1231 Encounter for screening mammogram for malignant neoplasm of breast: Secondary | ICD-10-CM

## 2019-11-02 ENCOUNTER — Ambulatory Visit
Admission: RE | Admit: 2019-11-02 | Discharge: 2019-11-02 | Disposition: A | Discharge status: Home / Self Care | Payer: PPO | Source: Ambulatory Visit | Attending: Internal Medicine | Admitting: Internal Medicine

## 2019-11-02 ENCOUNTER — Other Ambulatory Visit: Payer: Self-pay

## 2019-11-02 DIAGNOSIS — Z1231 Encounter for screening mammogram for malignant neoplasm of breast: Secondary | ICD-10-CM | POA: Insufficient documentation

## 2019-11-07 DIAGNOSIS — S42212A Unspecified displaced fracture of surgical neck of left humerus, initial encounter for closed fracture: Secondary | ICD-10-CM | POA: Diagnosis not present

## 2019-11-07 DIAGNOSIS — M25512 Pain in left shoulder: Secondary | ICD-10-CM | POA: Diagnosis not present

## 2019-11-07 DIAGNOSIS — S4992XA Unspecified injury of left shoulder and upper arm, initial encounter: Secondary | ICD-10-CM | POA: Diagnosis not present

## 2019-11-07 DIAGNOSIS — W010XXA Fall on same level from slipping, tripping and stumbling without subsequent striking against object, initial encounter: Secondary | ICD-10-CM | POA: Diagnosis not present

## 2019-11-16 DIAGNOSIS — M898X2 Other specified disorders of bone, upper arm: Secondary | ICD-10-CM | POA: Diagnosis not present

## 2019-11-16 DIAGNOSIS — S42202A Unspecified fracture of upper end of left humerus, initial encounter for closed fracture: Secondary | ICD-10-CM | POA: Diagnosis not present

## 2019-11-30 DIAGNOSIS — M898X2 Other specified disorders of bone, upper arm: Secondary | ICD-10-CM | POA: Diagnosis not present

## 2019-11-30 DIAGNOSIS — S42202D Unspecified fracture of upper end of left humerus, subsequent encounter for fracture with routine healing: Secondary | ICD-10-CM | POA: Diagnosis not present

## 2019-12-05 DIAGNOSIS — M2560 Stiffness of unspecified joint, not elsewhere classified: Secondary | ICD-10-CM | POA: Diagnosis not present

## 2019-12-05 DIAGNOSIS — M6281 Muscle weakness (generalized): Secondary | ICD-10-CM | POA: Diagnosis not present

## 2019-12-05 DIAGNOSIS — M79622 Pain in left upper arm: Secondary | ICD-10-CM | POA: Diagnosis not present

## 2019-12-14 DIAGNOSIS — M79622 Pain in left upper arm: Secondary | ICD-10-CM | POA: Diagnosis not present

## 2019-12-19 DIAGNOSIS — M79622 Pain in left upper arm: Secondary | ICD-10-CM | POA: Diagnosis not present

## 2019-12-26 DIAGNOSIS — S42202D Unspecified fracture of upper end of left humerus, subsequent encounter for fracture with routine healing: Secondary | ICD-10-CM | POA: Diagnosis not present

## 2019-12-27 DIAGNOSIS — M79622 Pain in left upper arm: Secondary | ICD-10-CM | POA: Diagnosis not present

## 2019-12-29 DIAGNOSIS — M79622 Pain in left upper arm: Secondary | ICD-10-CM | POA: Diagnosis not present

## 2020-01-02 ENCOUNTER — Other Ambulatory Visit: Payer: Self-pay

## 2020-01-02 ENCOUNTER — Ambulatory Visit: Payer: PPO | Admitting: Dermatology

## 2020-01-02 DIAGNOSIS — D229 Melanocytic nevi, unspecified: Secondary | ICD-10-CM | POA: Diagnosis not present

## 2020-01-02 DIAGNOSIS — L720 Epidermal cyst: Secondary | ICD-10-CM | POA: Diagnosis not present

## 2020-01-02 DIAGNOSIS — D1800 Hemangioma unspecified site: Secondary | ICD-10-CM

## 2020-01-02 DIAGNOSIS — D2271 Melanocytic nevi of right lower limb, including hip: Secondary | ICD-10-CM

## 2020-01-02 DIAGNOSIS — Z1283 Encounter for screening for malignant neoplasm of skin: Secondary | ICD-10-CM

## 2020-01-02 DIAGNOSIS — L219 Seborrheic dermatitis, unspecified: Secondary | ICD-10-CM

## 2020-01-02 DIAGNOSIS — M79622 Pain in left upper arm: Secondary | ICD-10-CM | POA: Diagnosis not present

## 2020-01-02 DIAGNOSIS — L820 Inflamed seborrheic keratosis: Secondary | ICD-10-CM

## 2020-01-02 DIAGNOSIS — D692 Other nonthrombocytopenic purpura: Secondary | ICD-10-CM

## 2020-01-02 DIAGNOSIS — L578 Other skin changes due to chronic exposure to nonionizing radiation: Secondary | ICD-10-CM

## 2020-01-02 DIAGNOSIS — L814 Other melanin hyperpigmentation: Secondary | ICD-10-CM | POA: Diagnosis not present

## 2020-01-02 DIAGNOSIS — L821 Other seborrheic keratosis: Secondary | ICD-10-CM | POA: Diagnosis not present

## 2020-01-02 NOTE — Progress Notes (Signed)
   Follow-Up Visit   Subjective  Amy Wells is a 79 y.o. female who presents for the following: Annual Exam (Total body skin exam) and seb derm (ears, dermotic oil prn).  She has a spot on her leg that gets irritated with shaving.   The following portions of the chart were reviewed this encounter and updated as appropriate:      Review of Systems:  No other skin or systemic complaints except as noted in HPI or Assessment and Plan.  Objective  Well appearing patient in no apparent distress; mood and affect are within normal limits.  A full examination was performed including scalp, head, eyes, ears, nose, lips, neck, chest, axillae, abdomen, back, buttocks, bilateral upper extremities, bilateral lower extremities, hands, feet, fingers, toes, fingernails, and toenails. All findings within normal limits unless otherwise noted below.  Objective  Right Lower Leg  x 1: Erythematous keratotic or waxy stuck-on papule  Objective  Right Lower Leg - Posterior: 1.0cm SQ nodule  Objective  bil ears: Ears clear  Objective  Right Lower Leg - Anterior: 5.56mm med brown macule slightly waxy   Assessment & Plan    Hemangiomas - Red papules - Discussed benign nature - Observe - Call for any changes  Seborrheic Keratoses - Stuck-on, waxy, tan-brown papules and plaques  - Discussed benign etiology and prognosis. - Observe - Call for any changes  Skin cancer screening performed today.  Melanocytic Nevi - Tan-brown and/or pink-flesh-colored symmetric macules and papules - Benign appearing on exam today - Observation - Call clinic for new or changing moles - Recommend daily use of broad spectrum spf 30+ sunscreen to sun-exposed areas.   Lentigines - Scattered tan macules - Discussed due to sun exposure - Benign, observe - Call for any changes  Purpura - Violaceous macules and patches - Benign - Related to age, sun damage and/or use of blood thinners - Observe -  Can use OTC arnica containing moisturizer such as Dermend Bruise Formula if desired - Call for worsening or other concerns  Actinic Damage - chronic, secondary to cumulative UV radiation exposure/sun exposure over time - diffuse scaly erythematous macules with underlying dyspigmentation - Recommend daily broad spectrum sunscreen SPF 30+ to sun-exposed areas, reapply every 2 hours as needed.  - Call for new or changing lesions.  Inflamed seborrheic keratosis Right Lower Leg  x 1  Destruction of lesion - Right Lower Leg  x 1  Destruction method: cryotherapy   Informed consent: discussed and consent obtained   Lesion destroyed using liquid nitrogen: Yes   Region frozen until ice ball extended beyond lesion: Yes   Outcome: patient tolerated procedure well with no complications   Post-procedure details: wound care instructions given    Epidermal cyst Right Lower Leg - Posterior  Benign, observe  Seborrheic dermatitis bil ears  Chronic, Controlled  Cont Dermotic oil 1-2gtts qd prn flares  Nevus Right Lower Leg - Anterior  Vs SK Benign-appearing.  Observation.  Call clinic for new or changing moles.  Recommend daily use of broad spectrum spf 30+ sunscreen to sun-exposed areas.    Return in about 1 year (around 01/01/2021) for TBSE.   I, Othelia Pulling, RMA, am acting as scribe for Brendolyn Patty, MD . Documentation: I have reviewed the above documentation for accuracy and completeness, and I agree with the above.  Brendolyn Patty MD

## 2020-01-02 NOTE — Patient Instructions (Signed)
Cryotherapy Aftercare  . Wash gently with soap and water everyday.   . Apply Vaseline and Band-Aid daily until healed.  

## 2020-01-04 DIAGNOSIS — B349 Viral infection, unspecified: Secondary | ICD-10-CM | POA: Diagnosis not present

## 2020-01-04 DIAGNOSIS — M79622 Pain in left upper arm: Secondary | ICD-10-CM | POA: Diagnosis not present

## 2020-01-04 DIAGNOSIS — J40 Bronchitis, not specified as acute or chronic: Secondary | ICD-10-CM | POA: Diagnosis not present

## 2020-01-04 DIAGNOSIS — J45902 Unspecified asthma with status asthmaticus: Secondary | ICD-10-CM | POA: Diagnosis not present

## 2020-01-09 DIAGNOSIS — M8588 Other specified disorders of bone density and structure, other site: Secondary | ICD-10-CM | POA: Diagnosis not present

## 2020-01-09 DIAGNOSIS — E782 Mixed hyperlipidemia: Secondary | ICD-10-CM | POA: Diagnosis not present

## 2020-01-10 DIAGNOSIS — M79622 Pain in left upper arm: Secondary | ICD-10-CM | POA: Diagnosis not present

## 2020-01-12 DIAGNOSIS — M79622 Pain in left upper arm: Secondary | ICD-10-CM | POA: Diagnosis not present

## 2020-01-16 DIAGNOSIS — Z0000 Encounter for general adult medical examination without abnormal findings: Secondary | ICD-10-CM | POA: Diagnosis not present

## 2020-01-16 DIAGNOSIS — Z23 Encounter for immunization: Secondary | ICD-10-CM | POA: Diagnosis not present

## 2020-01-16 DIAGNOSIS — J4520 Mild intermittent asthma, uncomplicated: Secondary | ICD-10-CM | POA: Diagnosis not present

## 2020-01-16 DIAGNOSIS — E782 Mixed hyperlipidemia: Secondary | ICD-10-CM | POA: Diagnosis not present

## 2020-01-16 DIAGNOSIS — D369 Benign neoplasm, unspecified site: Secondary | ICD-10-CM | POA: Diagnosis not present

## 2020-01-17 DIAGNOSIS — M79622 Pain in left upper arm: Secondary | ICD-10-CM | POA: Diagnosis not present

## 2020-01-17 DIAGNOSIS — B0052 Herpesviral keratitis: Secondary | ICD-10-CM | POA: Diagnosis not present

## 2020-01-25 DIAGNOSIS — Z1152 Encounter for screening for COVID-19: Secondary | ICD-10-CM | POA: Diagnosis not present

## 2020-01-25 DIAGNOSIS — Z03818 Encounter for observation for suspected exposure to other biological agents ruled out: Secondary | ICD-10-CM | POA: Diagnosis not present

## 2020-01-30 DIAGNOSIS — M19012 Primary osteoarthritis, left shoulder: Secondary | ICD-10-CM | POA: Diagnosis not present

## 2020-01-30 DIAGNOSIS — M7062 Trochanteric bursitis, left hip: Secondary | ICD-10-CM | POA: Diagnosis not present

## 2020-01-30 DIAGNOSIS — M1612 Unilateral primary osteoarthritis, left hip: Secondary | ICD-10-CM | POA: Diagnosis not present

## 2020-01-30 DIAGNOSIS — S42202D Unspecified fracture of upper end of left humerus, subsequent encounter for fracture with routine healing: Secondary | ICD-10-CM | POA: Diagnosis not present

## 2020-01-31 DIAGNOSIS — M79622 Pain in left upper arm: Secondary | ICD-10-CM | POA: Diagnosis not present

## 2020-02-02 DIAGNOSIS — M79622 Pain in left upper arm: Secondary | ICD-10-CM | POA: Diagnosis not present

## 2020-02-06 DIAGNOSIS — M79622 Pain in left upper arm: Secondary | ICD-10-CM | POA: Diagnosis not present

## 2020-02-08 DIAGNOSIS — M79622 Pain in left upper arm: Secondary | ICD-10-CM | POA: Diagnosis not present

## 2020-02-23 DIAGNOSIS — Z03818 Encounter for observation for suspected exposure to other biological agents ruled out: Secondary | ICD-10-CM | POA: Diagnosis not present

## 2020-02-23 DIAGNOSIS — Z20822 Contact with and (suspected) exposure to covid-19: Secondary | ICD-10-CM | POA: Diagnosis not present

## 2020-02-23 DIAGNOSIS — U071 COVID-19: Secondary | ICD-10-CM | POA: Diagnosis not present

## 2020-03-01 DIAGNOSIS — J208 Acute bronchitis due to other specified organisms: Secondary | ICD-10-CM | POA: Diagnosis not present

## 2020-03-01 DIAGNOSIS — U071 COVID-19: Secondary | ICD-10-CM | POA: Diagnosis not present

## 2020-05-21 DIAGNOSIS — B0052 Herpesviral keratitis: Secondary | ICD-10-CM | POA: Diagnosis not present

## 2020-05-22 DIAGNOSIS — B0052 Herpesviral keratitis: Secondary | ICD-10-CM | POA: Diagnosis not present

## 2020-05-22 DIAGNOSIS — M955 Acquired deformity of pelvis: Secondary | ICD-10-CM | POA: Diagnosis not present

## 2020-05-22 DIAGNOSIS — M62830 Muscle spasm of back: Secondary | ICD-10-CM | POA: Diagnosis not present

## 2020-05-22 DIAGNOSIS — M9905 Segmental and somatic dysfunction of pelvic region: Secondary | ICD-10-CM | POA: Diagnosis not present

## 2020-05-22 DIAGNOSIS — M9903 Segmental and somatic dysfunction of lumbar region: Secondary | ICD-10-CM | POA: Diagnosis not present

## 2020-05-28 DIAGNOSIS — B0052 Herpesviral keratitis: Secondary | ICD-10-CM | POA: Diagnosis not present

## 2020-06-05 DIAGNOSIS — J45902 Unspecified asthma with status asthmaticus: Secondary | ICD-10-CM | POA: Diagnosis not present

## 2020-06-05 DIAGNOSIS — J40 Bronchitis, not specified as acute or chronic: Secondary | ICD-10-CM | POA: Diagnosis not present

## 2020-06-05 DIAGNOSIS — R252 Cramp and spasm: Secondary | ICD-10-CM | POA: Diagnosis not present

## 2020-06-19 DIAGNOSIS — M9905 Segmental and somatic dysfunction of pelvic region: Secondary | ICD-10-CM | POA: Diagnosis not present

## 2020-06-19 DIAGNOSIS — M955 Acquired deformity of pelvis: Secondary | ICD-10-CM | POA: Diagnosis not present

## 2020-06-19 DIAGNOSIS — M9903 Segmental and somatic dysfunction of lumbar region: Secondary | ICD-10-CM | POA: Diagnosis not present

## 2020-06-19 DIAGNOSIS — M62830 Muscle spasm of back: Secondary | ICD-10-CM | POA: Diagnosis not present

## 2020-06-27 DIAGNOSIS — M7062 Trochanteric bursitis, left hip: Secondary | ICD-10-CM | POA: Diagnosis not present

## 2020-06-27 DIAGNOSIS — M1612 Unilateral primary osteoarthritis, left hip: Secondary | ICD-10-CM | POA: Diagnosis not present

## 2020-06-27 DIAGNOSIS — M25552 Pain in left hip: Secondary | ICD-10-CM | POA: Diagnosis not present

## 2020-07-09 DIAGNOSIS — M1612 Unilateral primary osteoarthritis, left hip: Secondary | ICD-10-CM | POA: Diagnosis not present

## 2020-07-09 DIAGNOSIS — M25552 Pain in left hip: Secondary | ICD-10-CM | POA: Diagnosis not present

## 2020-08-08 DIAGNOSIS — M62830 Muscle spasm of back: Secondary | ICD-10-CM | POA: Diagnosis not present

## 2020-08-08 DIAGNOSIS — M955 Acquired deformity of pelvis: Secondary | ICD-10-CM | POA: Diagnosis not present

## 2020-08-08 DIAGNOSIS — M9905 Segmental and somatic dysfunction of pelvic region: Secondary | ICD-10-CM | POA: Diagnosis not present

## 2020-08-08 DIAGNOSIS — M9903 Segmental and somatic dysfunction of lumbar region: Secondary | ICD-10-CM | POA: Diagnosis not present

## 2020-09-05 DIAGNOSIS — M9903 Segmental and somatic dysfunction of lumbar region: Secondary | ICD-10-CM | POA: Diagnosis not present

## 2020-09-05 DIAGNOSIS — M9905 Segmental and somatic dysfunction of pelvic region: Secondary | ICD-10-CM | POA: Diagnosis not present

## 2020-09-05 DIAGNOSIS — M62830 Muscle spasm of back: Secondary | ICD-10-CM | POA: Diagnosis not present

## 2020-09-05 DIAGNOSIS — M955 Acquired deformity of pelvis: Secondary | ICD-10-CM | POA: Diagnosis not present

## 2020-10-16 ENCOUNTER — Other Ambulatory Visit: Payer: Self-pay | Admitting: Internal Medicine

## 2020-10-16 DIAGNOSIS — Z1231 Encounter for screening mammogram for malignant neoplasm of breast: Secondary | ICD-10-CM

## 2020-10-17 DIAGNOSIS — M62830 Muscle spasm of back: Secondary | ICD-10-CM | POA: Diagnosis not present

## 2020-10-17 DIAGNOSIS — M9905 Segmental and somatic dysfunction of pelvic region: Secondary | ICD-10-CM | POA: Diagnosis not present

## 2020-10-17 DIAGNOSIS — M9903 Segmental and somatic dysfunction of lumbar region: Secondary | ICD-10-CM | POA: Diagnosis not present

## 2020-10-17 DIAGNOSIS — M955 Acquired deformity of pelvis: Secondary | ICD-10-CM | POA: Diagnosis not present

## 2020-10-22 DIAGNOSIS — M25552 Pain in left hip: Secondary | ICD-10-CM | POA: Diagnosis not present

## 2020-10-22 DIAGNOSIS — M1612 Unilateral primary osteoarthritis, left hip: Secondary | ICD-10-CM | POA: Diagnosis not present

## 2020-10-24 DIAGNOSIS — M9903 Segmental and somatic dysfunction of lumbar region: Secondary | ICD-10-CM | POA: Diagnosis not present

## 2020-10-24 DIAGNOSIS — M955 Acquired deformity of pelvis: Secondary | ICD-10-CM | POA: Diagnosis not present

## 2020-10-24 DIAGNOSIS — M9905 Segmental and somatic dysfunction of pelvic region: Secondary | ICD-10-CM | POA: Diagnosis not present

## 2020-10-24 DIAGNOSIS — M62830 Muscle spasm of back: Secondary | ICD-10-CM | POA: Diagnosis not present

## 2020-11-21 DIAGNOSIS — M955 Acquired deformity of pelvis: Secondary | ICD-10-CM | POA: Diagnosis not present

## 2020-11-21 DIAGNOSIS — M9903 Segmental and somatic dysfunction of lumbar region: Secondary | ICD-10-CM | POA: Diagnosis not present

## 2020-11-21 DIAGNOSIS — M9905 Segmental and somatic dysfunction of pelvic region: Secondary | ICD-10-CM | POA: Diagnosis not present

## 2020-11-21 DIAGNOSIS — M62830 Muscle spasm of back: Secondary | ICD-10-CM | POA: Diagnosis not present

## 2020-11-26 DIAGNOSIS — M1612 Unilateral primary osteoarthritis, left hip: Secondary | ICD-10-CM | POA: Diagnosis not present

## 2020-11-26 DIAGNOSIS — M461 Sacroiliitis, not elsewhere classified: Secondary | ICD-10-CM | POA: Diagnosis not present

## 2020-11-27 ENCOUNTER — Ambulatory Visit
Admission: RE | Admit: 2020-11-27 | Discharge: 2020-11-27 | Disposition: A | Discharge status: Home / Self Care | Payer: PPO | Source: Ambulatory Visit | Attending: Internal Medicine | Admitting: Internal Medicine

## 2020-11-27 ENCOUNTER — Other Ambulatory Visit: Payer: Self-pay

## 2020-11-27 DIAGNOSIS — Z1231 Encounter for screening mammogram for malignant neoplasm of breast: Secondary | ICD-10-CM | POA: Insufficient documentation

## 2020-12-03 DIAGNOSIS — M1612 Unilateral primary osteoarthritis, left hip: Secondary | ICD-10-CM | POA: Diagnosis not present

## 2020-12-04 DIAGNOSIS — B0052 Herpesviral keratitis: Secondary | ICD-10-CM | POA: Diagnosis not present

## 2020-12-17 DIAGNOSIS — M461 Sacroiliitis, not elsewhere classified: Secondary | ICD-10-CM | POA: Diagnosis not present

## 2020-12-17 DIAGNOSIS — M6281 Muscle weakness (generalized): Secondary | ICD-10-CM | POA: Diagnosis not present

## 2020-12-17 DIAGNOSIS — M545 Low back pain, unspecified: Secondary | ICD-10-CM | POA: Diagnosis not present

## 2020-12-17 DIAGNOSIS — M2560 Stiffness of unspecified joint, not elsewhere classified: Secondary | ICD-10-CM | POA: Diagnosis not present

## 2020-12-24 DIAGNOSIS — M1612 Unilateral primary osteoarthritis, left hip: Secondary | ICD-10-CM | POA: Diagnosis not present

## 2021-01-01 DIAGNOSIS — M461 Sacroiliitis, not elsewhere classified: Secondary | ICD-10-CM | POA: Diagnosis not present

## 2021-01-08 ENCOUNTER — Ambulatory Visit: Payer: PPO | Admitting: Dermatology

## 2021-01-08 ENCOUNTER — Other Ambulatory Visit: Payer: Self-pay

## 2021-01-08 DIAGNOSIS — D692 Other nonthrombocytopenic purpura: Secondary | ICD-10-CM

## 2021-01-08 DIAGNOSIS — L821 Other seborrheic keratosis: Secondary | ICD-10-CM

## 2021-01-08 DIAGNOSIS — L219 Seborrheic dermatitis, unspecified: Secondary | ICD-10-CM

## 2021-01-08 DIAGNOSIS — L814 Other melanin hyperpigmentation: Secondary | ICD-10-CM

## 2021-01-08 DIAGNOSIS — Z1283 Encounter for screening for malignant neoplasm of skin: Secondary | ICD-10-CM | POA: Diagnosis not present

## 2021-01-08 DIAGNOSIS — M461 Sacroiliitis, not elsewhere classified: Secondary | ICD-10-CM | POA: Diagnosis not present

## 2021-01-08 DIAGNOSIS — L578 Other skin changes due to chronic exposure to nonionizing radiation: Secondary | ICD-10-CM

## 2021-01-08 DIAGNOSIS — L650 Telogen effluvium: Secondary | ICD-10-CM | POA: Diagnosis not present

## 2021-01-08 DIAGNOSIS — D229 Melanocytic nevi, unspecified: Secondary | ICD-10-CM

## 2021-01-08 DIAGNOSIS — D1800 Hemangioma unspecified site: Secondary | ICD-10-CM

## 2021-01-08 DIAGNOSIS — L720 Epidermal cyst: Secondary | ICD-10-CM

## 2021-01-08 MED ORDER — MINOXIDIL 2.5 MG PO TABS: 2.5000 mg | ORAL_TABLET | Freq: Every day | ORAL | 3 refills | Status: DC

## 2021-01-08 NOTE — Patient Instructions (Addendum)
Telogen effluvium is a benign, self limited condition causing increased hair shedding usually for several months. It does not progress to baldness, and the hair eventually grows back on its own. It can be triggered by recent illness, recent surgery, thyroid disease, low iron stores, vitamin D deficiency, fad diets or rapid weight loss, hormonal changes such as pregnancy or birth control pills, and some medication. Usually the hair loss starts 2-3 months after the illness or health change. Rarely, it can continue for longer than a year.   Recommend minoxidil 5% (Rogaine for men) solution or foam to be applied to the scalp and left in. This should ideally be used twice daily for best results but it helps with hair regrowth when used at least three times per week. Rogaine initially can cause increased hair shedding for the first few weeks but this will stop with continued use. In studies, people who used minoxidil (Rogaine) for at least 6 months had thicker hair than people who did not. Minoxidil topical (Rogaine) only works as long as it continues to be used. If if it is no longer used then the hair it has been helping to regrow can fall out. Minoxidil topical (Rogaine) can cause increased facial hair growth which can usually be managed easily with a battery-operated hair trimmer. If facial hair growth is bothersome, switching to the 2% women's version can decrease the risk of unwanted facial hair growth.   Seborrheic Keratosis  What causes seborrheic keratoses? Seborrheic keratoses are harmless, common skin growths that first appear during adult life.  As time goes by, more growths appear.  Some people may develop a large number of them.  Seborrheic keratoses appear on both covered and uncovered body parts.  They are not caused by sunlight.  The tendency to develop seborrheic keratoses can be inherited.  They vary in color from skin-colored to gray, brown, or even black.  They can be either smooth or have a  rough, warty surface.   Seborrheic keratoses are superficial and look as if they were stuck on the skin.  Under the microscope this type of keratosis looks like layers upon layers of skin.  That is why at times the top layer may seem to fall off, but the rest of the growth remains and re-grows.    Treatment Seborrheic keratoses do not need to be treated, but can easily be removed in the office.  Seborrheic keratoses often cause symptoms when they rub on clothing or jewelry.  Lesions can be in the way of shaving.  If they become inflamed, they can cause itching, soreness, or burning.  Removal of a seborrheic keratosis can be accomplished by freezing, burning, or surgery. If any spot bleeds, scabs, or grows rapidly, please return to have it checked, as these can be an indication of a skin cancer.   For Bruising at arms  Can use OTC arnica containing moisturizer such as Dermend Bruise Formula if desired  Melanoma ABCDEs  Melanoma is the most dangerous type of skin cancer, and is the leading cause of death from skin disease.  You are more likely to develop melanoma if you: Have light-colored skin, light-colored eyes, or red or blond hair Spend a lot of time in the sun Tan regularly, either outdoors or in a tanning bed Have had blistering sunburns, especially during childhood Have a close family member who has had a melanoma Have atypical moles or large birthmarks  Early detection of melanoma is key since treatment is typically straightforward and  cure rates are extremely high if we catch it early.   The first sign of melanoma is often a change in a mole or a new dark spot.  The ABCDE system is a way of remembering the signs of melanoma.  A for asymmetry:  The two halves do not match. B for border:  The edges of the growth are irregular. C for color:  A mixture of colors are present instead of an even brown color. D for diameter:  Melanomas are usually (but not always) greater than 78mm - the  size of a pencil eraser. E for evolution:  The spot keeps changing in size, shape, and color.  Please check your skin once per month between visits. You can use a small mirror in front and a large mirror behind you to keep an eye on the back side or your body.   If you see any new or changing lesions before your next follow-up, please call to schedule a visit.  Please continue daily skin protection including broad spectrum sunscreen SPF 30+ to sun-exposed areas, reapplying every 2 hours as needed when you're outdoors.   Staying in the shade or wearing long sleeves, sun glasses (UVA+UVB protection) and wide brim hats (4-inch brim around the entire circumference of the hat) are also recommended for sun protection.    If you have any questions or concerns for your doctor, please call our main line at [phone] and press option 4 to reach your doctor's medical assistant. If no one answers, please leave a voicemail as directed and we will return your call as soon as possible. Messages left after 4 pm will be answered the following business day.   You may also send [place] a message via [place]. We typically respond to MyChart messages within 1-2 business days.  For prescription refills, please ask your pharmacy to contact our office. Our fax number is [phone].  If you have an urgent issue when the clinic is closed that cannot wait until the next business day, you can page your doctor at the number below.    Please note that while we do our best to be available for urgent issues outside of office hours, we are not available 24/7.   If you have an urgent issue and are unable to reach [place], you may choose to seek medical care at your doctor's office, retail clinic, urgent care center, or emergency room.  If you have a medical emergency, please immediately call 911 or go to the emergency department.  Pager Numbers  - Dr. Nehemiah Massed: [phone]  - Dr. Laurence Ferrari: [phone]  - Dr. Nicole Kindred:  [phone]  In the event of inclement weather, please call our main line at [phone] for an update on the status of any delays or closures.  Dermatology Medication Tips: Please keep the boxes that topical medications come in in order to help keep track of the instructions about where and how to use these. Pharmacies typically print the medication instructions only on the boxes and not directly on the medication tubes.   If your medication is too expensive, please contact our office at [phone] option 4 or send [place] a message through [place].   We are unable to tell what your co-pay for medications will be in advance as this is different depending on your insurance coverage. However, we may be able to find a substitute medication at lower cost or fill out paperwork to get insurance to cover a needed medication.   If a prior authorization  is required to get your medication covered by your insurance company, please allow [place] 1-2 business days to complete this process.  Drug prices often vary depending on where the prescription is filled and some pharmacies may offer cheaper prices.  The website www.goodrx.com contains coupons for medications through different pharmacies. The prices here do not account for what the cost may be with help from insurance (it may be cheaper with your insurance), but the website can give you the price if you did not use any insurance.  - You can print the associated coupon and take it with your prescription to the pharmacy.  - You may also stop by our office during regular business hours and pick up a GoodRx coupon card.  - If you need your prescription sent electronically to a different pharmacy, notify our office through [facility] or by phone at [phone] option 4.

## 2021-01-08 NOTE — Progress Notes (Signed)
Follow-Up Visit   Subjective  Amy Wells is a 80 y.o. female who presents for the following: Annual Exam (Patient here today for 1 year tbse. Patient would like to discuss bruising at arms. Patient reports she has noticing more hair loss in the last year. ).  Especially over past 6 months. She noticed it after being sick with Covid in Jan 22 and after her Covid booster in the spring.  It is thinning really bad at the back of her head.  No other surgeries or new meds this past year.  She had a hysterectomy in June 2021.  Mother had thinning hair, but not bad.  She will be seeing her PCP soon and will get labs done then and will have her thyroid checked.  She has a h/o asthma and will go on prednisone a couple times a year for that.  Patient here for full body skin exam and skin cancer screening.  The following portions of the chart were reviewed this encounter and updated as appropriate:      Review of Systems: No other skin or systemic complaints except as noted in HPI or Assessment and Plan.   Objective  Well appearing patient in no apparent distress; mood and affect are within normal limits.  A full examination was performed including scalp, head, eyes, ears, nose, lips, neck, chest, axillae, abdomen, back, buttocks, bilateral upper extremities, bilateral lower extremities, hands, feet, fingers, toes, fingernails, and toenails. All findings within normal limits unless otherwise noted below.  BL Ears Clear today  scalp Diffuse hair thinning on entire scalp with increased thinning on right occipital scalp, normal hair pull       right calf 1 cm Subcutaneous nodule. No change per pt  Assessment & Plan  Seborrheic dermatitis BL Ears  Seborrheic Dermatitis  -  is a chronic persistent rash characterized by pinkness and scaling most commonly of the mid face but also can occur on the scalp (dandruff), ears; mid chest, mid back and groin.  It tends to be exacerbated by  stress and cooler weather.  People who have neurologic disease may experience new onset or exacerbation of existing seborrheic dermatitis.  The condition is not curable but treatable and can be controlled.  Chronic condition with duration or expected duration over one year. Currently well-controlled.  Continue Dermotic drops qd/bid prn itch as instructed  Patient will call if she needs refills.     Telogen effluvium scalp  Possible related to covid infection or covid vaccine booster, atypical pattern for androgenetic alopecia  Discussed topical vrs low dose oral minoxidil to help stimulate hair growth.  Pt prefers oral.  No h/o HTN, CAD or CHF.  BP 126/69 Start minoxidil 1.25 mg tablet (1/2 of 2.5 mg tablet) by mouth -Patient will start with 1/4 tablet by mouth for 2 weeks if no side effects (dizziness, lightheadedness, fluid retention) will increase to 1/2 tablet by mouth daily.   Start OTC Biotin 2.5 mg PO daily   Will check TSH, CBC, Ferritin, Vit D level- add to yearly labs at upcoming PCP visit  Telogen effluvium is a benign, self limited condition causing increased hair shedding usually for several months. It does not progress to baldness, and the hair eventually grows back on its own. It can be triggered by recent illness, recent surgery, thyroid disease, low iron stores, vitamin D deficiency, fad diets or rapid weight loss, hormonal changes such as pregnancy or birth control pills, and some medication. Usually the hair  loss starts 2-3 months after the illness or health change. Rarely, it can continue for longer than a year.     Ferritin - scalp  TSH - scalp  CBC (no diff) - scalp  minoxidil (LONITEN) 2.5 MG tablet - scalp Take 1 tablet (2.5 mg total) by mouth daily. Take 1/2 tablet by mouth daily  Vitamin D, 25-hydroxy - scalp  Epidermal inclusion cyst right calf  Stable, asymptomatic, will observe  Benign-appearing. Exam most consistent with an epidermal inclusion  cyst. Discussed that a cyst is a benign growth that can grow over time and sometimes get irritated or inflamed. Recommend observation if it is not bothersome. Discussed option of surgical excision to remove it if it is growing, symptomatic, or other changes noted. Please call for new or changing lesions so they can be evaluated.   Lentigines - Scattered tan macules - Due to sun exposure - Benign-appearing, observe - Recommend daily broad spectrum sunscreen SPF 30+ to sun-exposed areas, reapply every 2 hours as needed. - Call for any changes  Seborrheic Keratoses - Stuck-on, waxy, tan-brown papules and/or plaques  - Benign-appearing - Discussed benign etiology and prognosis. - Observe - Call for any changes  Melanocytic Nevi - Tan-brown and/or pink-flesh-colored symmetric macules and papules - Benign appearing on exam today - Observation - Call clinic for new or changing moles - Recommend daily use of broad spectrum spf 30+ sunscreen to sun-exposed areas.   Purpura - Chronic; persistent and recurrent.  Treatable, but not curable. - Violaceous macules and patches - Benign - Related to trauma, age, sun damage and/or use of blood thinners, chronic use of topical and/or oral steroids - Observe - Can use OTC arnica containing moisturizer such as Dermend Bruise Formula if desired - Call for worsening or other concerns  Hemangiomas - Red papules - Discussed benign nature - Observe - Call for any changes  Actinic Damage - Chronic condition, secondary to cumulative UV/sun exposure - diffuse scaly erythematous macules with underlying dyspigmentation - Recommend daily broad spectrum sunscreen SPF 30+ to sun-exposed areas, reapply every 2 hours as needed.  - Staying in the shade or wearing long sleeves, sun glasses (UVA+UVB protection) and wide brim hats (4-inch brim around the entire circumference of the hat) are also recommended for sun protection.  - Call for new or changing  lesions.  Skin cancer screening performed today.  Return for 3 month hair follow up, 1 year tbse.  I, Ruthell Rummage, CMA, am acting as scribe for Brendolyn Patty, MD.  Documentation: I have reviewed the above documentation for accuracy and completeness, and I agree with the above.  Brendolyn Patty MD

## 2021-01-14 DIAGNOSIS — E782 Mixed hyperlipidemia: Secondary | ICD-10-CM | POA: Diagnosis not present

## 2021-01-15 DIAGNOSIS — L650 Telogen effluvium: Secondary | ICD-10-CM | POA: Diagnosis not present

## 2021-01-15 DIAGNOSIS — M461 Sacroiliitis, not elsewhere classified: Secondary | ICD-10-CM | POA: Diagnosis not present

## 2021-01-21 DIAGNOSIS — E538 Deficiency of other specified B group vitamins: Secondary | ICD-10-CM | POA: Diagnosis not present

## 2021-01-21 DIAGNOSIS — E782 Mixed hyperlipidemia: Secondary | ICD-10-CM | POA: Diagnosis not present

## 2021-01-21 DIAGNOSIS — J4520 Mild intermittent asthma, uncomplicated: Secondary | ICD-10-CM | POA: Diagnosis not present

## 2021-01-21 DIAGNOSIS — Z0000 Encounter for general adult medical examination without abnormal findings: Secondary | ICD-10-CM | POA: Diagnosis not present

## 2021-01-24 DIAGNOSIS — M461 Sacroiliitis, not elsewhere classified: Secondary | ICD-10-CM | POA: Diagnosis not present

## 2021-01-31 ENCOUNTER — Other Ambulatory Visit: Payer: Self-pay | Admitting: Orthopedic Surgery

## 2021-02-01 DIAGNOSIS — M461 Sacroiliitis, not elsewhere classified: Secondary | ICD-10-CM | POA: Diagnosis not present

## 2021-02-12 DIAGNOSIS — M461 Sacroiliitis, not elsewhere classified: Secondary | ICD-10-CM | POA: Diagnosis not present

## 2021-02-12 NOTE — Patient Instructions (Addendum)
Your procedure is scheduled on: 02/26/21 - Tuesday Report to the Registration Desk on the 1st floor of the [facility]. To find out your arrival time, please call [phone] between 1PM - 3PM on: 02/25/21 - Monday Covid Test - 02/22/21 between 8 am and 12 pm.  REMEMBER: Instructions that are not followed completely may result in serious medical risk, up to and including death; or upon the discretion of your surgeon and anesthesiologist your surgery may need to be rescheduled.  Do not eat food after midnight the night before surgery.  No gum chewing, lozengers or hard candies.  You may however, drink CLEAR liquids up to 2 hours before you are scheduled to arrive for your surgery. Do not drink anything within 2 hours of your scheduled arrival time.  Clear liquids include: - water  - apple juice without pulp - gatorade (not RED, PURPLE, OR BLUE) - black coffee or tea (Do NOT add milk or creamers to the coffee or tea) Do NOT drink anything that is not on this list.   In addition, your doctor has ordered for you to drink the provided  Ensure Pre-Surgery Clear Carbohydrate Drink  Drinking this carbohydrate drink up to two hours before surgery helps to reduce insulin resistance and improve patient outcomes. Please complete drinking 2 hours prior to scheduled arrival time.  TAKE THESE MEDICATIONS THE MORNING OF SURGERY WITH A SIP OF WATER:  - Fluticasone-Salmeterol (ADVAIR) 100-50 MCG/DOSE AEPB - levothyroxine (SYNTHROID, LEVOTHROID) 50 MCG tablet - prednisoLONE acetate (PRED FORTE) 1 % ophthalmic suspension  One week prior to surgery: Stop Anti-inflammatories (NSAIDS) such as Advil, Aleve, Ibuprofen, Motrin, Naproxen, Naprosyn and Aspirin based products such as Excedrin, Goodys Powder, BC Powder.  Stop ANY OVER THE COUNTER supplements until after surgery.Potassium , Turmeric, vitamin E 180   You may however, continue to take Tylenol if needed for pain up until the day of  surgery.  No Alcohol for 24 hours before or after surgery.  No Smoking including e-cigarettes for 24 hours prior to surgery.  No chewable tobacco products for at least 6 hours prior to surgery.  No nicotine patches on the day of surgery.  Do not use any "recreational" drugs for at least a week prior to your surgery.  Please be advised that the combination of cocaine and anesthesia may have negative outcomes, up to and including death. If you test positive for cocaine, your surgery will be cancelled.  On the morning of surgery brush your teeth with toothpaste and water, you may rinse your mouth with mouthwash if you wish. Do not swallow any toothpaste or mouthwash.  Use CHG Soap or wipes as directed on instruction sheet.  Do not wear jewelry, make-up, hairpins, clips or nail polish.  Do not wear lotions, powders, or perfumes.   Do not shave body from the neck down 48 hours prior to surgery just in case you cut yourself which could leave a site for infection.  Also, freshly shaved skin may become irritated if using the CHG soap.  Contact lenses, hearing aids and dentures may not be worn into surgery.  Do not bring valuables to the hospital. [facility] is not responsible for any missing/lost belongings or valuables.   Notify your doctor if there is any change in your medical condition (cold, fever, infection).  Wear comfortable clothing (specific to your surgery type) to the hospital.  After surgery, you can help prevent lung complications by doing breathing exercises.  Take deep breaths and cough  every 1-2 hours. Your doctor may order a device called an Incentive Spirometer to help you take deep breaths. When coughing or sneezing, hold a pillow firmly against your incision with both hands. This is called splinting. Doing this helps protect your incision. It also decreases belly discomfort.  If you are being admitted to the hospital overnight, leave your suitcase in the  car. After surgery it may be brought to your room.  If you are being discharged the day of surgery, you will not be allowed to drive home. You will need a responsible adult (18 years or older) to drive you home and stay with you that night.   If you are taking public transportation, you will need to have a responsible adult (18 years or older) with you. Please confirm with your physician that it is acceptable to use public transportation.   Please call the [facility] at [phone] if you have any questions about these instructions.  Surgery Visitation Policy:  Patients undergoing a surgery or procedure may have one family member or support person with them as long as that person is not COVID-19 positive or experiencing its symptoms.  That person may remain in the waiting area during the procedure and may rotate out with other people.  Inpatient Visitation:    Visiting hours are 7 a.m. to 8 p.m. Up to two visitors ages 16+ are allowed at one time in a patient room. The visitors may rotate out with other people during the day. Visitors must check out when they leave, or other visitors will not be allowed. One designated support person may remain overnight. The visitor must pass COVID-19 screenings, use hand sanitizer when entering and exiting the patients room and wear a mask at all times, including in the patients room. Patients must also wear a mask when staff or their visitor are in the room. Masking is required regardless of vaccination status.

## 2021-02-14 ENCOUNTER — Other Ambulatory Visit: Payer: Self-pay

## 2021-02-14 ENCOUNTER — Encounter
Admission: RE | Admit: 2021-02-14 | Discharge: 2021-02-14 | Disposition: A | Discharge status: Home / Self Care | Payer: PPO | Source: Ambulatory Visit | Attending: Orthopedic Surgery | Admitting: Orthopedic Surgery

## 2021-02-14 DIAGNOSIS — Z01818 Encounter for other preprocedural examination: Secondary | ICD-10-CM | POA: Diagnosis not present

## 2021-02-14 DIAGNOSIS — Z01810 Encounter for preprocedural cardiovascular examination: Secondary | ICD-10-CM | POA: Diagnosis not present

## 2021-02-14 HISTORY — DX: Unspecified osteoarthritis, unspecified site: M19.90

## 2021-02-14 HISTORY — DX: Other complications of anesthesia, initial encounter: T88.59XA

## 2021-02-14 HISTORY — DX: Cardiac arrhythmia, unspecified: I49.9

## 2021-02-14 HISTORY — DX: Pneumonia, unspecified organism: J18.9

## 2021-02-14 LAB — URINALYSIS, ROUTINE W REFLEX MICROSCOPIC
Bilirubin Urine: NEGATIVE
Glucose, UA: NEGATIVE mg/dL
Hgb urine dipstick: NEGATIVE
Ketones, ur: NEGATIVE mg/dL
Leukocytes,Ua: NEGATIVE
Nitrite: NEGATIVE
Protein, ur: NEGATIVE mg/dL
Specific Gravity, Urine: 1.01 (ref 1.005–1.030)
pH: 6 (ref 5.0–8.0)

## 2021-02-14 LAB — SURGICAL PCR SCREEN
MRSA, PCR: NEGATIVE
Staphylococcus aureus: NEGATIVE

## 2021-02-14 LAB — TYPE AND SCREEN
ABO/RH(D): A POS
Antibody Screen: NEGATIVE

## 2021-02-19 DIAGNOSIS — M461 Sacroiliitis, not elsewhere classified: Secondary | ICD-10-CM | POA: Diagnosis not present

## 2021-02-22 ENCOUNTER — Other Ambulatory Visit
Admission: RE | Admit: 2021-02-22 | Discharge: 2021-02-22 | Disposition: A | Discharge status: Home / Self Care | Payer: PPO | Source: Ambulatory Visit | Attending: Orthopedic Surgery | Admitting: Orthopedic Surgery

## 2021-02-22 ENCOUNTER — Other Ambulatory Visit: Payer: Self-pay

## 2021-02-22 DIAGNOSIS — Z1152 Encounter for screening for COVID-19: Secondary | ICD-10-CM

## 2021-02-22 DIAGNOSIS — Z20822 Contact with and (suspected) exposure to covid-19: Secondary | ICD-10-CM | POA: Diagnosis not present

## 2021-02-22 DIAGNOSIS — Z01812 Encounter for preprocedural laboratory examination: Secondary | ICD-10-CM | POA: Insufficient documentation

## 2021-02-23 LAB — SARS CORONAVIRUS 2 (TAT 6-24 HRS): SARS Coronavirus 2: NEGATIVE

## 2021-02-26 ENCOUNTER — Inpatient Hospital Stay: Payer: PPO

## 2021-02-26 ENCOUNTER — Encounter
Admission: AD | Disposition: A | Discharge status: Home Health | Payer: Self-pay | Source: Home / Self Care | Attending: Orthopedic Surgery

## 2021-02-26 ENCOUNTER — Ambulatory Visit: Payer: PPO | Admitting: Urgent Care

## 2021-02-26 ENCOUNTER — Encounter: Payer: Self-pay | Admitting: Orthopedic Surgery

## 2021-02-26 ENCOUNTER — Ambulatory Visit: Payer: PPO

## 2021-02-26 ENCOUNTER — Inpatient Hospital Stay
Admission: AD | Admit: 2021-02-26 | Discharge: 2021-02-27 | DRG: 470 | Disposition: A | Discharge status: Home Health | Payer: PPO | Attending: Orthopedic Surgery | Admitting: Orthopedic Surgery

## 2021-02-26 ENCOUNTER — Other Ambulatory Visit: Payer: Self-pay

## 2021-02-26 ENCOUNTER — Ambulatory Visit: Payer: PPO | Admitting: Anesthesiology

## 2021-02-26 DIAGNOSIS — Z79899 Other long term (current) drug therapy: Secondary | ICD-10-CM | POA: Diagnosis not present

## 2021-02-26 DIAGNOSIS — E039 Hypothyroidism, unspecified: Secondary | ICD-10-CM | POA: Diagnosis present

## 2021-02-26 DIAGNOSIS — G8918 Other acute postprocedural pain: Secondary | ICD-10-CM

## 2021-02-26 DIAGNOSIS — Z825 Family history of asthma and other chronic lower respiratory diseases: Secondary | ICD-10-CM | POA: Diagnosis not present

## 2021-02-26 DIAGNOSIS — Z79890 Hormone replacement therapy (postmenopausal): Secondary | ICD-10-CM

## 2021-02-26 DIAGNOSIS — Z96642 Presence of left artificial hip joint: Secondary | ICD-10-CM | POA: Diagnosis not present

## 2021-02-26 DIAGNOSIS — J45909 Unspecified asthma, uncomplicated: Secondary | ICD-10-CM | POA: Diagnosis not present

## 2021-02-26 DIAGNOSIS — M1612 Unilateral primary osteoarthritis, left hip: Secondary | ICD-10-CM | POA: Diagnosis not present

## 2021-02-26 DIAGNOSIS — Z8262 Family history of osteoporosis: Secondary | ICD-10-CM | POA: Diagnosis not present

## 2021-02-26 DIAGNOSIS — Z471 Aftercare following joint replacement surgery: Secondary | ICD-10-CM | POA: Diagnosis not present

## 2021-02-26 DIAGNOSIS — Z8249 Family history of ischemic heart disease and other diseases of the circulatory system: Secondary | ICD-10-CM

## 2021-02-26 DIAGNOSIS — Z419 Encounter for procedure for purposes other than remedying health state, unspecified: Secondary | ICD-10-CM

## 2021-02-26 DIAGNOSIS — R531 Weakness: Secondary | ICD-10-CM | POA: Diagnosis not present

## 2021-02-26 HISTORY — PX: TOTAL HIP ARTHROPLASTY: SHX124

## 2021-02-26 LAB — CBC
HCT: 34.3 % — ABNORMAL LOW (ref 36.0–46.0)
Hemoglobin: 11.2 g/dL — ABNORMAL LOW (ref 12.0–15.0)
MCH: 31.5 pg (ref 26.0–34.0)
MCHC: 32.7 g/dL (ref 30.0–36.0)
MCV: 96.6 fL (ref 80.0–100.0)
Platelets: 242 10*3/uL (ref 150–400)
RBC: 3.55 MIL/uL — ABNORMAL LOW (ref 3.87–5.11)
RDW: 12.5 % (ref 11.5–15.5)
WBC: 8.8 10*3/uL (ref 4.0–10.5)
nRBC: 0 % (ref 0.0–0.2)

## 2021-02-26 LAB — ABO/RH: ABO/RH(D): A POS

## 2021-02-26 LAB — CREATININE, SERUM
Creatinine, Ser: 0.56 mg/dL (ref 0.44–1.00)
GFR, Estimated: >60 mL/min (ref >60)

## 2021-02-26 SURGERY — ARTHROPLASTY, HIP, TOTAL, ANTERIOR APPROACH
Anesthesia: Spinal | Site: Hip | Laterality: Left

## 2021-02-26 MED ORDER — B COMPLEX-C PO TABS
1.0000 | ORAL_TABLET | Freq: Every day | ORAL | Status: DC
  Administered 2021-02-27: 1 via ORAL
  Filled 2021-02-26: qty 1

## 2021-02-26 MED ORDER — ALUM & MAG HYDROXIDE-SIMETH 200-200-20 MG/5ML PO SUSP: 30.0000 mL | ORAL | Status: DC | PRN

## 2021-02-26 MED ORDER — MICROFIBRILLAR COLL HEMOSTAT EX PADS
MEDICATED_PAD | CUTANEOUS | Status: DC | PRN
  Administered 2021-02-26: 2 via TOPICAL

## 2021-02-26 MED ORDER — VITAMIN D3 25 MCG (1000 UNIT) PO TABS
1000.0000 [IU] | ORAL_TABLET | Freq: Every day | ORAL | Status: DC
  Administered 2021-02-27: 1000 [IU] via ORAL
  Filled 2021-02-26: qty 1

## 2021-02-26 MED ORDER — FLUTICASONE FUROATE-VILANTEROL 100-25 MCG/ACT IN AEPB
1.0000 | INHALATION_SPRAY | Freq: Every day | RESPIRATORY_TRACT | Status: DC
  Filled 2021-02-26: qty 28

## 2021-02-26 MED ORDER — CEFAZOLIN SODIUM-DEXTROSE 1-4 GM/50ML-% IV SOLN
INTRAVENOUS | Status: AC
  Administered 2021-02-26: 1 g via INTRAVENOUS
  Filled 2021-02-26: qty 50

## 2021-02-26 MED ORDER — FENTANYL CITRATE (PF) 100 MCG/2ML IJ SOLN
25.0000 ug | INTRAMUSCULAR | Status: DC | PRN
  Administered 2021-02-26 (×3): 25 ug via INTRAVENOUS

## 2021-02-26 MED ORDER — FAMOTIDINE 20 MG PO TABS
ORAL_TABLET | ORAL | Status: AC
  Filled 2021-02-26: qty 1

## 2021-02-26 MED ORDER — ENOXAPARIN SODIUM 40 MG/0.4ML IJ SOSY: 40.0000 mg | PREFILLED_SYRINGE | INTRAMUSCULAR | Status: DC

## 2021-02-26 MED ORDER — PHENOL 1.4 % MT LIQD
1.0000 | OROMUCOSAL | Status: DC | PRN
  Filled 2021-02-26: qty 177

## 2021-02-26 MED ORDER — DOCUSATE SODIUM 100 MG PO CAPS
ORAL_CAPSULE | ORAL | Status: AC
  Administered 2021-02-26: 100 mg via ORAL
  Filled 2021-02-26: qty 1

## 2021-02-26 MED ORDER — MORPHINE SULFATE (PF) 4 MG/ML IV SOLN
INTRAVENOUS | Status: AC
  Administered 2021-02-26: 1 mg via INTRAVENOUS
  Filled 2021-02-26: qty 1

## 2021-02-26 MED ORDER — ACETAMINOPHEN 325 MG PO TABS: 325.0000 mg | ORAL_TABLET | Freq: Four times a day (QID) | ORAL | Status: DC | PRN

## 2021-02-26 MED ORDER — METOCLOPRAMIDE HCL 5 MG/ML IJ SOLN
INTRAMUSCULAR | Status: AC
  Filled 2021-02-26: qty 2

## 2021-02-26 MED ORDER — OXYCODONE HCL 5 MG PO TABS
ORAL_TABLET | ORAL | Status: AC
  Filled 2021-02-26: qty 1

## 2021-02-26 MED ORDER — MENTHOL 3 MG MT LOZG
1.0000 | LOZENGE | OROMUCOSAL | Status: DC | PRN
  Filled 2021-02-26: qty 9

## 2021-02-26 MED ORDER — PHENYLEPHRINE HCL-NACL 20-0.9 MG/250ML-% IV SOLN
INTRAVENOUS | Status: DC | PRN
  Administered 2021-02-26: 25 ug/min via INTRAVENOUS

## 2021-02-26 MED ORDER — OXYCODONE HCL 5 MG PO TABS: 5.0000 mg | ORAL_TABLET | ORAL | Status: DC | PRN

## 2021-02-26 MED ORDER — METHOCARBAMOL 1000 MG/10ML IJ SOLN
500.0000 mg | Freq: Four times a day (QID) | INTRAVENOUS | Status: DC | PRN
  Administered 2021-02-26: 500 mg via INTRAVENOUS
  Filled 2021-02-26 (×2): qty 5

## 2021-02-26 MED ORDER — METOCLOPRAMIDE HCL 5 MG/ML IJ SOLN
5.0000 mg | Freq: Three times a day (TID) | INTRAMUSCULAR | Status: DC | PRN
  Administered 2021-02-26: 10 mg via INTRAVENOUS

## 2021-02-26 MED ORDER — PREDNISOLONE ACETATE 1 % OP SUSP
1.0000 [drp] | Freq: Every day | OPHTHALMIC | Status: DC
  Administered 2021-02-26 – 2021-02-27 (×2): 1 [drp] via OPHTHALMIC
  Filled 2021-02-26: qty 1

## 2021-02-26 MED ORDER — LEVOTHYROXINE SODIUM 50 MCG PO TABS
50.0000 ug | ORAL_TABLET | Freq: Every day | ORAL | Status: DC
  Administered 2021-02-27: 50 ug via ORAL
  Filled 2021-02-26: qty 1

## 2021-02-26 MED ORDER — BUPIVACAINE HCL (PF) 0.5 % IJ SOLN
INTRAMUSCULAR | Status: DC | PRN
  Administered 2021-02-26: 2.5 mL via INTRATHECAL

## 2021-02-26 MED ORDER — DIPHENHYDRAMINE HCL 12.5 MG/5ML PO ELIX
12.5000 mg | ORAL_SOLUTION | ORAL | Status: DC | PRN
  Filled 2021-02-26: qty 10

## 2021-02-26 MED ORDER — LORATADINE 10 MG PO TABS
5.0000 mg | ORAL_TABLET | Freq: Every evening | ORAL | Status: DC
  Filled 2021-02-26 (×2): qty 0.5

## 2021-02-26 MED ORDER — ONDANSETRON HCL 4 MG/2ML IJ SOLN
INTRAMUSCULAR | Status: AC
  Administered 2021-02-26: 4 mg via INTRAVENOUS
  Filled 2021-02-26: qty 2

## 2021-02-26 MED ORDER — CHLORHEXIDINE GLUCONATE 0.12 % MT SOLN
15.0000 mL | Freq: Once | OROMUCOSAL | Status: AC
  Administered 2021-02-26: 15 mL via OROMUCOSAL

## 2021-02-26 MED ORDER — SODIUM CHLORIDE 0.9 % IR SOLN
Status: DC | PRN
  Administered 2021-02-26: 1004 mL

## 2021-02-26 MED ORDER — METHOCARBAMOL 500 MG PO TABS
500.0000 mg | ORAL_TABLET | Freq: Four times a day (QID) | ORAL | Status: DC | PRN
  Administered 2021-02-27 (×2): 500 mg via ORAL

## 2021-02-26 MED ORDER — HYDROCODONE-ACETAMINOPHEN 5-325 MG PO TABS: 1.0000 | ORAL_TABLET | ORAL | Status: DC | PRN

## 2021-02-26 MED ORDER — BUPIVACAINE HCL (PF) 0.5 % IJ SOLN
INTRAMUSCULAR | Status: AC
  Filled 2021-02-26: qty 10

## 2021-02-26 MED ORDER — POLYETHYLENE GLYCOL 3350 17 G PO PACK
17.0000 g | PACK | Freq: Every day | ORAL | Status: DC | PRN
  Filled 2021-02-26: qty 1

## 2021-02-26 MED ORDER — CEFAZOLIN SODIUM-DEXTROSE 2-4 GM/100ML-% IV SOLN
INTRAVENOUS | Status: AC
  Filled 2021-02-26: qty 100

## 2021-02-26 MED ORDER — METHOCARBAMOL 500 MG PO TABS
ORAL_TABLET | ORAL | Status: AC
  Administered 2021-02-26: 500 mg via ORAL
  Filled 2021-02-26: qty 1

## 2021-02-26 MED ORDER — OXYCODONE HCL 5 MG PO TABS
5.0000 mg | ORAL_TABLET | Freq: Once | ORAL | Status: AC | PRN
  Administered 2021-02-26: 5 mg via ORAL

## 2021-02-26 MED ORDER — BUPIVACAINE HCL (PF) 0.25 % IJ SOLN
INTRAMUSCULAR | Status: AC
  Filled 2021-02-26: qty 60

## 2021-02-26 MED ORDER — ACETAMINOPHEN 10 MG/ML IV SOLN
INTRAVENOUS | Status: AC
  Filled 2021-02-26: qty 100

## 2021-02-26 MED ORDER — SODIUM CHLORIDE FLUSH 0.9 % IV SOLN
INTRAVENOUS | Status: AC
  Filled 2021-02-26: qty 80

## 2021-02-26 MED ORDER — ONDANSETRON HCL 4 MG/2ML IJ SOLN
4.0000 mg | Freq: Four times a day (QID) | INTRAMUSCULAR | Status: DC | PRN
  Administered 2021-02-27: 4 mg via INTRAVENOUS

## 2021-02-26 MED ORDER — CEFAZOLIN SODIUM-DEXTROSE 1-4 GM/50ML-% IV SOLN
INTRAVENOUS | Status: AC
  Filled 2021-02-26: qty 50

## 2021-02-26 MED ORDER — PHENYLEPHRINE HCL-NACL 20-0.9 MG/250ML-% IV SOLN
INTRAVENOUS | Status: AC
  Filled 2021-02-26: qty 250

## 2021-02-26 MED ORDER — ORAL CARE MOUTH RINSE: 15.0000 mL | Freq: Once | OROMUCOSAL | Status: AC

## 2021-02-26 MED ORDER — BUPIVACAINE LIPOSOME 1.3 % IJ SUSP
INTRAMUSCULAR | Status: AC
  Filled 2021-02-26: qty 40

## 2021-02-26 MED ORDER — PANTOPRAZOLE SODIUM 40 MG PO TBEC
40.0000 mg | DELAYED_RELEASE_TABLET | Freq: Every day | ORAL | Status: DC
  Administered 2021-02-26 – 2021-02-27 (×2): 40 mg via ORAL
  Filled 2021-02-26 (×2): qty 1

## 2021-02-26 MED ORDER — CEFAZOLIN SODIUM-DEXTROSE 2-4 GM/100ML-% IV SOLN
2.0000 g | INTRAVENOUS | Status: AC
  Administered 2021-02-26: 2 g via INTRAVENOUS

## 2021-02-26 MED ORDER — MORPHINE SULFATE (PF) 4 MG/ML IV SOLN: 0.5000 mg | INTRAVENOUS | Status: DC | PRN

## 2021-02-26 MED ORDER — PROPOFOL 1000 MG/100ML IV EMUL
INTRAVENOUS | Status: AC
  Filled 2021-02-26: qty 100

## 2021-02-26 MED ORDER — ACETAMINOPHEN 325 MG PO TABS
650.0000 mg | ORAL_TABLET | Freq: Four times a day (QID) | ORAL | Status: DC
  Administered 2021-02-27: 650 mg via ORAL

## 2021-02-26 MED ORDER — PHENYLEPHRINE HCL (PRESSORS) 10 MG/ML IV SOLN
INTRAVENOUS | Status: DC | PRN
  Administered 2021-02-26 (×3): 80 ug via INTRAVENOUS
  Administered 2021-02-26: 240 ug via INTRAVENOUS

## 2021-02-26 MED ORDER — OXYCODONE HCL 5 MG/5ML PO SOLN: 5.0000 mg | Freq: Once | ORAL | Status: AC | PRN

## 2021-02-26 MED ORDER — ZOLPIDEM TARTRATE 5 MG PO TABS: 5.0000 mg | ORAL_TABLET | Freq: Every evening | ORAL | Status: DC | PRN

## 2021-02-26 MED ORDER — ACETAMINOPHEN 10 MG/ML IV SOLN
INTRAVENOUS | Status: DC | PRN
Start: 2021-02-26 — End: 2021-02-26
  Administered 2021-02-26: 1000 mg via INTRAVENOUS

## 2021-02-26 MED ORDER — SODIUM CHLORIDE (PF) 0.9 % IJ SOLN
INTRAMUSCULAR | Status: DC | PRN
  Administered 2021-02-26: 90 mL via INTRAMUSCULAR

## 2021-02-26 MED ORDER — PROPOFOL 10 MG/ML IV BOLUS
INTRAVENOUS | Status: DC | PRN
  Administered 2021-02-26 (×3): 20 mg via INTRAVENOUS

## 2021-02-26 MED ORDER — MAGNESIUM HYDROXIDE 400 MG/5ML PO SUSP: 30.0000 mL | Freq: Every day | ORAL | Status: DC

## 2021-02-26 MED ORDER — CEFAZOLIN SODIUM-DEXTROSE 1-4 GM/50ML-% IV SOLN: 1.0000 g | Freq: Four times a day (QID) | INTRAVENOUS | Status: AC

## 2021-02-26 MED ORDER — OXYCODONE HCL 5 MG PO TABS
ORAL_TABLET | ORAL | Status: AC
  Administered 2021-02-27: 10 mg via ORAL
  Filled 2021-02-26: qty 1

## 2021-02-26 MED ORDER — ESTRADIOL 0.1 MG/GM VA CREA
2.0000 g | TOPICAL_CREAM | VAGINAL | Status: DC
  Filled 2021-02-26: qty 42.5

## 2021-02-26 MED ORDER — DOCUSATE SODIUM 100 MG PO CAPS: 100.0000 mg | ORAL_CAPSULE | Freq: Two times a day (BID) | ORAL | Status: DC

## 2021-02-26 MED ORDER — HYDROCODONE-ACETAMINOPHEN 7.5-325 MG PO TABS: 1.0000 | ORAL_TABLET | ORAL | Status: DC | PRN

## 2021-02-26 MED ORDER — METOCLOPRAMIDE HCL 5 MG PO TABS
5.0000 mg | ORAL_TABLET | Freq: Three times a day (TID) | ORAL | Status: DC | PRN
  Filled 2021-02-26: qty 2

## 2021-02-26 MED ORDER — FLEET ENEMA 7-19 GM/118ML RE ENEM: 1.0000 | ENEMA | Freq: Once | RECTAL | Status: DC | PRN

## 2021-02-26 MED ORDER — LACTATED RINGERS IV SOLN: INTRAVENOUS | Status: DC

## 2021-02-26 MED ORDER — CHLORHEXIDINE GLUCONATE 0.12 % MT SOLN
OROMUCOSAL | Status: AC
  Filled 2021-02-26: qty 15

## 2021-02-26 MED ORDER — KETAMINE HCL 10 MG/ML IJ SOLN
INTRAMUSCULAR | Status: DC | PRN
  Administered 2021-02-26: 20 mg via INTRAVENOUS

## 2021-02-26 MED ORDER — MONTELUKAST SODIUM 10 MG PO TABS
10.0000 mg | ORAL_TABLET | Freq: Every day | ORAL | Status: DC
  Filled 2021-02-26 (×2): qty 1

## 2021-02-26 MED ORDER — BISACODYL 10 MG RE SUPP
10.0000 mg | Freq: Every day | RECTAL | Status: DC | PRN
  Filled 2021-02-26: qty 1

## 2021-02-26 MED ORDER — PROMETHAZINE HCL 25 MG/ML IJ SOLN: 6.2500 mg | INTRAMUSCULAR | Status: DC | PRN

## 2021-02-26 MED ORDER — OXYCODONE HCL 5 MG PO TABS
5.0000 mg | ORAL_TABLET | ORAL | Status: DC | PRN
  Administered 2021-02-27: 10 mg via ORAL
  Administered 2021-02-27: 5 mg via ORAL

## 2021-02-26 MED ORDER — OXYCODONE HCL 5 MG PO TABS
ORAL_TABLET | ORAL | Status: AC
  Administered 2021-02-26: 10 mg via ORAL
  Filled 2021-02-26: qty 2

## 2021-02-26 MED ORDER — POTASSIUM GLUCONATE 550 (90 K) MG PO TABS: 90.0000 mg | ORAL_TABLET | Freq: Every day | ORAL | Status: DC

## 2021-02-26 MED ORDER — HYDROCODONE-ACETAMINOPHEN 7.5-325 MG PO TABS
ORAL_TABLET | ORAL | Status: AC
  Administered 2021-02-26: 1 via ORAL
  Filled 2021-02-26: qty 2

## 2021-02-26 MED ORDER — FAMOTIDINE 20 MG PO TABS
20.0000 mg | ORAL_TABLET | Freq: Once | ORAL | Status: AC
  Administered 2021-02-26: 20 mg via ORAL

## 2021-02-26 MED ORDER — TURMERIC 400 MG PO CAPS: 400.0000 mg | ORAL_CAPSULE | Freq: Every day | ORAL | Status: DC

## 2021-02-26 MED ORDER — ACETAMINOPHEN 325 MG PO TABS
ORAL_TABLET | ORAL | Status: AC
  Administered 2021-02-26: 650 mg via ORAL
  Filled 2021-02-26: qty 2

## 2021-02-26 MED ORDER — ONDANSETRON HCL 4 MG PO TABS
4.0000 mg | ORAL_TABLET | Freq: Four times a day (QID) | ORAL | Status: DC | PRN
  Filled 2021-02-26: qty 1

## 2021-02-26 MED ORDER — PROPOFOL 500 MG/50ML IV EMUL
INTRAVENOUS | Status: DC | PRN
  Administered 2021-02-26: 75 ug/kg/min via INTRAVENOUS

## 2021-02-26 MED ORDER — FENTANYL CITRATE (PF) 100 MCG/2ML IJ SOLN
INTRAMUSCULAR | Status: AC
  Administered 2021-02-26: 25 ug via INTRAVENOUS
  Filled 2021-02-26: qty 2

## 2021-02-26 MED ORDER — NEOMYCIN-POLYMYXIN B GU 40-200000 IR SOLN
Status: AC
  Filled 2021-02-26: qty 40

## 2021-02-26 MED ORDER — SODIUM CHLORIDE 0.9 % IV SOLN: INTRAVENOUS | Status: DC

## 2021-02-26 MED ORDER — ACETAMINOPHEN 10 MG/ML IV SOLN: 1000.0000 mg | Freq: Once | INTRAVENOUS | Status: DC | PRN

## 2021-02-26 MED ORDER — KETAMINE HCL 50 MG/5ML IJ SOSY
PREFILLED_SYRINGE | INTRAMUSCULAR | Status: AC
  Filled 2021-02-26: qty 5

## 2021-02-26 MED ORDER — VITAMIN E 45 MG (100 UNIT) PO CAPS
400.0000 [IU] | ORAL_CAPSULE | Freq: Every day | ORAL | Status: DC
  Administered 2021-02-27: 400 [IU] via ORAL
  Filled 2021-02-26: qty 4

## 2021-02-26 SURGICAL SUPPLY — 63 items
BLADE SAGITTAL AGGR TOOTH XLG (BLADE) ×2 IMPLANT
BNDG COHESIVE 6X5 TAN ST LF (GAUZE/BANDAGES/DRESSINGS) ×5 IMPLANT
CANISTER WOUND CARE 500ML ATS (WOUND CARE) ×2 IMPLANT
CHLORAPREP W/TINT 26 (MISCELLANEOUS) ×2 IMPLANT
COVER BACK TABLE REUSABLE LG (DRAPES) ×2 IMPLANT
DRAPE 3/4 80X56 (DRAPES) ×6 IMPLANT
DRAPE C-ARM XRAY 36X54 (DRAPES) ×2 IMPLANT
DRAPE INCISE IOBAN 66X60 STRL (DRAPES) IMPLANT
DRAPE POUCH INSTRU U-SHP 10X18 (DRAPES) ×2 IMPLANT
DRESSING SURGICEL FIBRLLR 1X2 (HEMOSTASIS) ×2 IMPLANT
DRSG MEPILEX SACRM 8.7X9.8 (GAUZE/BANDAGES/DRESSINGS) ×2 IMPLANT
DRSG OPSITE POSTOP 4X8 (GAUZE/BANDAGES/DRESSINGS) ×4 IMPLANT
DRSG SURGICEL FIBRILLAR 1X2 (HEMOSTASIS) ×4
ELECT BLADE 6.5 EXT (BLADE) ×2 IMPLANT
ELECT REM PT RETURN 9FT ADLT (ELECTROSURGICAL) ×2
ELECTRODE REM PT RTRN 9FT ADLT (ELECTROSURGICAL) ×1 IMPLANT
GAUZE 4X4 16PLY ~~LOC~~+RFID DBL (SPONGE) ×2 IMPLANT
GLOVE SURG SYN 9.0  PF PI (GLOVE) ×2
GLOVE SURG SYN 9.0 PF PI (GLOVE) ×2 IMPLANT
GLOVE SURG UNDER POLY LF SZ9 (GLOVE) ×2 IMPLANT
GOWN SRG 2XL LVL 4 RGLN SLV (GOWNS) ×1 IMPLANT
GOWN STRL NON-REIN 2XL LVL4 (GOWNS) ×1
GOWN STRL REUS W/ TWL LRG LVL3 (GOWN DISPOSABLE) ×1 IMPLANT
GOWN STRL REUS W/TWL LRG LVL3 (GOWN DISPOSABLE) ×1
HEMOVAC 400CC 10FR (MISCELLANEOUS) IMPLANT
HIP FEM HD S 28 (Head) ×1 IMPLANT
HOLDER FOLEY CATH W/STRAP (MISCELLANEOUS) ×2 IMPLANT
KIT PREVENA INCISION MGT 13 (CANNISTER) ×2 IMPLANT
LINER DM 28MM (Liner) ×2 IMPLANT
LINER DM SZH 28X56 (Liner) IMPLANT
MANIFOLD NEPTUNE II (INSTRUMENTS) ×2 IMPLANT
MAT ABSORB  FLUID 56X50 GRAY (MISCELLANEOUS) ×1
MAT ABSORB FLUID 56X50 GRAY (MISCELLANEOUS) ×1 IMPLANT
NDL SAFETY ECLIPSE 18X1.5 (NEEDLE) ×1 IMPLANT
NDL SPNL 20GX3.5 QUINCKE YW (NEEDLE) ×2 IMPLANT
NEEDLE HYPO 18GX1.5 SHARP (NEEDLE) ×1
NEEDLE SPNL 20GX3.5 QUINCKE YW (NEEDLE) ×4 IMPLANT
NS IRRIG 1000ML POUR BTL (IV SOLUTION) ×2 IMPLANT
PACK HIP COMPR (MISCELLANEOUS) ×2 IMPLANT
SCALPEL PROTECTED #10 DISP (BLADE) ×4 IMPLANT
SHELL ACETABULAR DM  56MM (Shell) ×1 IMPLANT
SOL PREP PVP 2OZ (MISCELLANEOUS)
SOLUTION PREP PVP 2OZ (MISCELLANEOUS) ×1 IMPLANT
SOLUTION PRONTOSAN WOUND 350ML (IRRIGATION / IRRIGATOR) IMPLANT
SPONGE DRAIN TRACH 4X4 STRL 2S (GAUZE/BANDAGES/DRESSINGS) ×1 IMPLANT
SPONGE T-LAP 18X18 ~~LOC~~+RFID (SPONGE) ×4 IMPLANT
STAPLER SKIN PROX 35W (STAPLE) ×2 IMPLANT
STEM FEMORAL SZ2 LAT COLLARED (Stem) ×1 IMPLANT
STRAP SAFETY 5IN WIDE (MISCELLANEOUS) ×2 IMPLANT
SUT DVC 2 QUILL PDO  T11 36X36 (SUTURE) ×1
SUT DVC 2 QUILL PDO T11 36X36 (SUTURE) ×1 IMPLANT
SUT SILK 0 (SUTURE) ×1
SUT SILK 0 30XBRD TIE 6 (SUTURE) ×1 IMPLANT
SUT V-LOC 90 ABS DVC 3-0 CL (SUTURE) ×2 IMPLANT
SUT VIC AB 1 CT1 36 (SUTURE) ×2 IMPLANT
SYR 20ML LL LF (SYRINGE) ×2 IMPLANT
SYR 30ML LL (SYRINGE) ×2 IMPLANT
SYR 50ML LL SCALE MARK (SYRINGE) ×4 IMPLANT
SYR BULB IRRIG 60ML STRL (SYRINGE) ×2 IMPLANT
TAPE MICROFOAM 4IN (TAPE) ×1 IMPLANT
TOWEL OR 17X26 4PK STRL BLUE (TOWEL DISPOSABLE) ×2 IMPLANT
TRAY FOLEY MTR SLVR 16FR STAT (SET/KITS/TRAYS/PACK) ×2 IMPLANT
WATER STERILE IRR 500ML POUR (IV SOLUTION) ×2 IMPLANT

## 2021-02-26 NOTE — Anesthesia Preprocedure Evaluation (Addendum)
Anesthesia Evaluation  Patient identified by MRN, date of birth, ID band Patient awake    Reviewed: Allergy & Precautions, H&P , NPO status , Patient's Chart, lab work & pertinent test results  History of Anesthesia Complications Negative for: history of anesthetic complications  Airway Mallampati: II  TM Distance: >3 FB     Dental no notable dental hx.  Upper incisor missing:   Pulmonary asthma , former smoker,    Pulmonary exam normal        Cardiovascular Exercise Tolerance: Good negative cardio ROS   Rhythm:Regular Rate:Normal     Neuro/Psych negative neurological ROS  negative psych ROS   GI/Hepatic negative GI ROS, Neg liver ROS,   Endo/Other  Hypothyroidism   Renal/GU negative Renal ROS  negative genitourinary   Musculoskeletal  (+) Arthritis , Primary osteoarthritis of left hip   Abdominal Normal abdominal exam  (+)   Peds  Hematology negative hematology ROS (+)   Anesthesia Other Findings Past Medical History: No date: Asthma No date: Chicken pox 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  No date: Hypothyroidism  Past Surgical History: No date: BREAST CYST ASPIRATION; Left     Comment:  neg yrs ago: BREAST EXCISIONAL BIOPSY; Left     Comment:  neg 2009: CATARACT EXTRACTION; Right No date: COLONOSCOPY No date: FUNCTIONAL ENDOSCOPIC SINUS SURGERY 1972: TUBAL LIGATION  BMI    Body Mass Index: 20.83 kg/m      Reproductive/Obstetrics negative OB ROS                            Anesthesia Physical  Anesthesia Plan  ASA: II  Anesthesia Plan: General/Spinal   Post-op Pain Management:    Induction:   PONV Risk Score and Plan: Propofol infusion and TIVA  Airway Management Planned: Natural Airway and Nasal Cannula  Additional Equipment:   Intra-op Plan:   Post-operative Plan:   Informed Consent: I have reviewed the patients History and  Physical, chart, labs and discussed the procedure including the risks, benefits and alternatives for the proposed anesthesia with the patient or authorized representative who has indicated his/her understanding and acceptance.     Dental advisory given  Plan Discussed with: CRNA and Anesthesiologist  Anesthesia Plan Comments:       Anesthesia Quick Evaluation

## 2021-02-26 NOTE — Evaluation (Signed)
Physical Therapy Evaluation Patient Details Name: Amy Wells MRN: [id number] DOB: [birth year] Today's Date: 02/26/2021  History of Present Illness  admitted for acute hospitalization s/p L THA, WBAT (02/26/21)  Clinical Impression  Patient resting in bed upon arrival to room; husband and nurse at bedside. Patient endorses ongoing nausea (meds received during session) and pain to L LE (9-10/10 beginning of session, 7-8/10 end of session); eager for OOB to chair for position change and pain-relief.  Demonstrates fair/good post-op strength (3-/5) and ROM to L LE; fair/good activation/isolation of L LE with therex and functional movement.  Currently requires min assist for bed mobility; min assist for sit/stand and bed/chair with RW, min assist.  Mod WBing bilat LEs, decreased stance time/weight acceptance to L LE; generally hesitant for WBing at this time (anticipate improvement in [place] as pain better controlled). Additional gait/activity deferred secondary to continued nausea, vomiting during session; will continue to assess/progress in subsequent session as appropriate. Would benefit from skilled PT to address above deficits and promote optimal return to PLOF.; Recommend transition to HHPT upon discharge from acute hospitalization.      Recommendations for follow up therapy are one component of a multi-disciplinary discharge planning process, led by the attending physician.  Recommendations may be updated based on patient status, additional functional criteria and insurance authorization.  Follow Up Recommendations Home health PT    Assistance Recommended at Discharge PRN  Patient can return home with the following  A little help with walking and/or transfers;A little help with bathing/dressing/bathroom    Equipment Recommendations Rolling walker (2 wheels);BSC/3in1  Recommendations for Other Services       Functional Status Assessment Patient has had a recent decline in their  functional status and demonstrates the ability to make significant improvements in function in a reasonable and predictable amount of time.     Precautions / Restrictions Precautions Precautions: Fall Restrictions Weight Bearing Restrictions: Yes LLE Weight Bearing: Weight bearing as tolerated      Mobility  Bed Mobility Overal bed mobility: Needs Assistance Bed Mobility: Supine to Sit     Supine to sit: Min assist     General bed mobility comments: step by step cuing to sequence movement; generally guarded with all movement patterns    Transfers Overall transfer level: Needs assistance Equipment used: Rolling walker (2 wheels) Transfers: Sit to/from Stand;Bed to chair/wheelchair/BSC Sit to Stand: Min assist Stand pivot transfers: Min assist         General transfer comment: cuing for hand placement; excessive weight shift to R LE    Ambulation/Gait               General Gait Details: deferred secondary to nausea, active vomiting during session  Stairs            Wheelchair Mobility    Modified Rankin (Stroke Patients Only)       Balance Overall balance assessment: Needs assistance Sitting-balance support: No upper extremity supported;Feet supported Sitting balance-Leahy Scale: Good     Standing balance support: Bilateral upper extremity supported Standing balance-Leahy Scale: Fair                               Pertinent Vitals/Pain Pain Assessment: 0-10 Pain Score: 8  Pain Location: L hip Pain Descriptors / Indicators: Aching;Grimacing;Guarding Pain Intervention(s): Limited activity within patient's tolerance;Monitored during session;Repositioned;RN gave pain meds during session    Home Living Family/patient expects to be discharged to::  Private residence Living Arrangements: Spouse/significant other Available Help at Discharge: Family Type of Home: House Home Access: Stairs to enter Entrance Stairs-Rails:  Right Entrance Stairs-Number of Steps: 2   [place]: Two level;Able to live on main level with bedroom/bathroom Home Equipment: None      Prior Function Prior Level of Function : Independent/Modified Independent             Mobility Comments: Indep with ADls, household and community mobilization without assist device; denies recent fall history; + driving; active, enjoys hiking with husband.       Hand Dominance        Extremity/Trunk Assessment   Upper Extremity Assessment Upper Extremity Assessment: Overall WFL for tasks assessed    Lower Extremity Assessment Lower Extremity Assessment:  (L hip grossly 3-/5, limited by post-op pain/soreness; otherwise, LEs grossly WFL)       Communication   Communication: No difficulties  Cognition Arousal/Alertness: Awake/alert Behavior During Therapy: WFL for tasks assessed/performed Overall Cognitive Status: Within Functional Limits for tasks assessed                                          General Comments      Exercises Other Exercises Other Exercises: Supine LE therex, 1x10, act assist ROM: ankle pumps, quad sets, SAQs, heel slides, hip abduct/adduct.  Good isolated strength and movement control   Assessment/Plan    PT Assessment Patient needs continued PT services  PT Problem List Decreased strength;Decreased range of motion;Decreased activity tolerance;Decreased balance;Decreased mobility;Decreased coordination;Decreased knowledge of use of DME;Decreased safety awareness;Decreased knowledge of precautions       PT Treatment Interventions DME instruction;Gait training;Stair training;Functional mobility training;Therapeutic activities;Therapeutic exercise;Balance training;Patient/family education    PT Goals (Current goals can be found in the Care Plan section)  Acute Rehab PT Goals Patient Stated Goal: to return home PT Goal Formulation: With patient/family Time For Goal Achievement:  03/12/21 Potential to Achieve Goals: Good    Frequency BID     Co-evaluation               AM-PAC PT "6 Clicks" Mobility  Outcome Measure Help needed turning from your back to your side while in a flat bed without using bedrails?: None Help needed moving from lying on your back to sitting on the side of a flat bed without using bedrails?: A Little Help needed moving to and from a bed to a chair (including a wheelchair)?: A Little Help needed standing up from a chair using your arms (e.g., wheelchair or bedside chair)?: A Little Help needed to walk in hospital room?: A Little Help needed climbing 3-5 steps with a railing? : A Little 6 Click Score: 19    End of Session Equipment Utilized During Treatment: Gait belt Activity Tolerance: Patient tolerated treatment well Patient left: in chair;with call bell/phone within reach Nurse Communication: Mobility status PT Visit Diagnosis: Muscle weakness (generalized) (M62.81);Difficulty in walking, not elsewhere classified (R26.2)    Time: 7544-9201 PT Time Calculation (min) (ACUTE ONLY): 29 min   Charges:   PT Evaluation $PT Eval Moderate Complexity: 1 Mod PT Treatments $Therapeutic Exercise: 8-22 mins        Argie Lober H. Owens Shark, PT, DPT, NCS 02/26/21, 5:32 PM [phone]

## 2021-02-26 NOTE — H&P (Signed)
Chief Complaint  Patient presents with   Hip Pain  H&P left THA 13/23    History of the Present Illness: Amy Wells is a 81 y.o. female here today for history and physical for left total hip arthroplasty with Dr. Hessie Knows on 02/26/2021. Patient has x-rays from Jun 27, 2020 showing advanced left hip osteoarthritis please central joint space loss with large osteophytes, subchondral sclerosing and subchondral cyst formation. Patient has had years of progressive left hip pain. She tries to be active and walks all the time the pain is interfering with her quality of life and activities of daily living. Patient has had cortisone injections in the left hip initially giving her relief most recently no improvement. For years, she has done yoga, and gone to the fitness center to do strengthening exercises. She states she cannot do any activities due to aggravation of her left hip pain.  The patient is allergic to TORSEMIDE.   The patient is retired. She enjoys hiking.  I have reviewed past medical, surgical, social and family history, and allergies as documented in the EMR.  Past Medical History: Past Medical History:  Diagnosis Date   Acquired hypothyroidism, unspecified 12/31/2015   Asthma without status asthmaticus, unspecified   Breast lump   Cataract  right eye   Chicken pox   Herpes simplex of eye  right eye   Hyperlipidemia, mixed   Prolapse of female bladder, acquired   Past Surgical History: Past Surgical History:  Procedure Laterality Date   TUBAL LIGATION 1972   FUNCTIONAL ENDOSCOPIC SINUS SURGERY 06/20/2005   CATARACT EXTRACTION Right 2012   COLONOSCOPY 05/05/2013  Adenomatous Polyp: CBF 04/2018 Recal ltr mailed 04/2018   TOOTH EXTRACTION Left 11/03/2018  Front left tooth   COLONOSCOPY 01/27/2019  PHCP/Non-bleeding internal hemorrhoids/Diverticulosis/Otherwise normal - No repeat per TKT   LAPAROSCOPIC COLPOPEXY FOR SUSPENSION VAGINAL ROBOTIC N/A 09/12/2019  Procedure: ROBOT  ASSISTED LAPAROSCOPY, SURGICAL; COLPOPEXY FOR SUSPENSION OF VAGINAL APEX; Surgeon: Colletta Maryland, MD; Location: [place]; Service: Gynecology; Laterality: N/A;   CYSTOURETHROSCOPY N/A 09/12/2019  Procedure: CYSTOURETHROSCOPY (SEPARATE PROCEDURE); Surgeon: Colletta Maryland, MD; Location: [place]; Service: Gynecology; Laterality: N/A;   SLING FOR STRESS INCONTINENCE N/A 09/12/2019  Procedure: SLING OPERATION FOR STRESS INCONTINENCE (EG, FASCIA OR SYNTHETIC); Surgeon: Colletta Maryland, MD; Location: [place]; Service: Gynecology; Laterality: N/A;   BIOPSY SKIN LEG Left  Left Thigh   BREAST BIOPSY 1970s   HYSTERECTOMY   Past Family History: Family History  Problem Relation Age of Onset   Asthma Mother   High blood pressure (Hypertension) Mother   Allergies Mother   Osteoporosis (Thinning of bones) Mother   Heart failure Father   Heart disease Father   Breast cancer Paternal Aunt   Medications: Current Outpatient Medications Ordered in Epic  Medication Sig Dispense Refill   acetaminophen (TYLENOL) 500 MG tablet Take 500 mg by mouth as needed for Pain   ADVAIR DISKUS 100-50 mcg/dose diskus inhaler Inhale 1 inhalation into the lungs 2 (two) times daily as needed 1 each 11   cholecalciferol, vitamin D3, (VITAMIN D3) 125 mcg (5,000 unit) tablet Take 5,000 Units by mouth every morning   docusate sodium (STOOL SOFTENER ORAL) Take 1 tablet by mouth once daily as needed   estradioL (ESTRACE) 0.01 % (0.1 mg/gram) vaginal cream Place 2 g vaginally twice a week 42.5 g 5   IBUPROFEN (ADVIL ORAL) Take 200 mg by mouth as needed   levocetirizine (XYZAL) 5 MG tablet Take 5  mg by mouth every morning   levothyroxine (SYNTHROID) 50 MCG tablet Take 1 tablet (50 mcg total) by mouth every morning before breakfast (0630) ON EMPTY STOMACH WITH A GLASS OF WATER AT LEAST 30-60 MIN BEFORE BREAKFAST 90 tablet 3   montelukast (SINGULAIR) 10 mg tablet Take 1 tablet (10 mg total) by  mouth once daily 90 tablet 3   naproxen sodium (ALEVE, ANAPROX) 220 MG tablet Take 220 mg by mouth as needed for Pain.   POTASSIUM ORAL Take by mouth OTC One daily   prednisoLONE acetate (PRED FORTE) 1 % ophthalmic suspension Place 1 drop into the right eye 2 (two) times daily Morning and night   TURMERIC ORAL Take 1 tablet by mouth every morning   VITAMIN B COMPLEX (B COMPLEX ORAL) Take 1 tablet by mouth every morning One daily   VITAMIN E ACETATE (VITAMIN E ORAL) Take 1 tablet by mouth every morning One daily   No current Epic-ordered facility-administered medications on file.   Allergies: Allergies  Allergen Reactions   Demerol [Meperidine] Nausea   Codeine Sulfate Anxiety   Magnesium Diarrhea   Torsemide Unknown    Body mass index is 22.44 kg/m.  Review of Systems: A comprehensive 14 point ROS was performed, reviewed, and the pertinent orthopaedic findings are documented in the HPI.  Vitals:  02/22/21 1412  BP: (!) 140/70    General Physical Examination:  General:  Well developed, well nourished, no apparent distress, normal affect, antalgic gait  HEENT: Head normocephalic, atraumatic, PERRL.   Abdomen: Soft, non tender, non distended, Bowel sounds present.  Heart: Examination of the heart reveals regular, rate, and rhythm. There is no murmur noted on ascultation. There is a normal apical pulse.  Lungs: Lungs are clear to auscultation. There is no wheeze, rhonchi, or crackles. There is normal expansion of bilateral chest walls.   Left hip: On exam, left hip has 10 degrees internal rotation, 20 degrees external. No swelling or edema throughout the left leg  Radiographs:  X-rays of the left hip reviewed and today from 06/27/2000 showed complete loss of central joint space narrowing with severe subchondral cyst formation throughout the femoral head with large osteophytes along the femoral head. Large superior acetabular spurring with severe subchondral sclerosing in  superior acetabulum. No evidence of acute bony abnormality  Assessment: ICD-10-CM  1. Primary osteoarthritis of left hip M16.12   Plan: 39. 81 year old female with advanced left hip osteoarthritis. Patient is mobility has declined over the last couple of years. Pain interferes with quality of life and activities of daily living. Risks, benefits, complications of a left total hip arthroplasty have been discussed with the patient. Patient has agreed and consented procedure with Dr. Hessie Knows on 02/26/2021.    Electronically signed by Feliberto Gottron, PA at 02/22/2021 2:39 PM EST    Reviewed  H+P. No changes noted.

## 2021-02-26 NOTE — Progress Notes (Signed)
PHARMACIST - PHYSICIAN ORDER COMMUNICATION  CONCERNING: P&T Medication Policy on Herbal Medications  DESCRIPTION:  This patients order for:  Tumeric and Potassium Gluconate  has been noted.  This product(s) is classified as an herbal or natural product. Due to a lack of definitive safety studies or FDA approval, nonstandard manufacturing practices, plus the potential risk of unknown drug-drug interactions while on inpatient medications, the Pharmacy and Therapeutics Committee does not permit the use of herbal or natural products of this type within [facility].   ACTION TAKEN: The pharmacy department is unable to verify this order at this time. Please reevaluate patients clinical condition at discharge and address if the herbal or natural product(s) should be resumed at that time.  Paulina Fusi, PharmD, BCPS 02/26/2021 11:08 AM

## 2021-02-26 NOTE — Op Note (Signed)
02/26/2021  8:45 AM  PATIENT:  Amy Wells  81 y.o. female  PRE-OPERATIVE DIAGNOSIS:  Primary osteoarthritis of left hip  M16.12  POST-OPERATIVE DIAGNOSIS:  Primary osteoarthritis of left hip  M16.12  PROCEDURE:  Procedure(s): TOTAL HIP ARTHROPLASTY ANTERIOR APPROACH (Left)  SURGEON: Laurene Footman, MD  ASSISTANTS: None  ANESTHESIA:   spinal  EBL:  Total I/O In: 100 [IV Piggyback:100] Out: 125 [Urine:125]  BLOOD ADMINISTERED:none  DRAINS:  Incisional wound VAC    LOCAL MEDICATIONS USED:  MARCAINE    and OTHER Exparel  SPECIMEN: Left femoral head  DISPOSITION OF SPECIMEN:  PATHOLOGY  COUNTS:  YES  TOURNIQUET:  * No tourniquets in log *  IMPLANTS: Medacta AMIS 2 lateralized stem with 56 mm Mpact DM cup and liner with metal S 28 mm head  DICTATION: .Dragon Dictation   The patient was brought to the operating room and after spinal anesthesia was obtained patient was placed on the operative table with the ipsilateral foot into the Medacta attachment, contralateral leg on a well-padded table. C-arm was brought in and preop template x-ray taken. After prepping and draping in usual sterile fashion appropriate patient identification and timeout procedures were completed. Anterior approach to the hip was obtained and centered over the greater trochanter and TFL muscle. The subcutaneous tissue was incised hemostasis being achieved by electrocautery. TFL fascia was incised and the muscle retracted laterally deep retractor placed. The lateral femoral circumflex vessels were identified and ligated. The anterior capsule was exposed and a capsulotomy performed. The neck was identified and a femoral neck cut carried out with a saw. The head was removed without difficulty and showed sclerotic femoral head and acetabulum. Reaming was carried out to 56 mm and a 56 mm cup trial gave appropriate tightness to the acetabular component a 56 DM cup was impacted into position. The leg was then  externally rotated and ischiofemoral and pubofemoral releases carried out. The femur was sequentially broached to a size 2, size 2 lateralized with S head trials were placed and the final components chosen. The 2 lateralized stem was inserted along with a metal S 28 mm head and 56 mm liner. The hip was reduced and was stable the wound was thoroughly irrigated with fibrillar placed along the posterior capsule and medial neck. The deep fascia ws closed using a heavy Quill after infiltration of 30 cc of quarter percent Sensorcaine with epinephrine diluted with Exparel throughout the case .3-0 V-loc to close the skin with skin staples.  Incisional wound VAC applied and patient was sent to recovery in stable condition.   PLAN OF CARE: Admit to inpatient

## 2021-02-26 NOTE — Anesthesia Procedure Notes (Signed)
Spinal  Patient location during procedure: OR Start time: 02/26/2021 7:24 AM End time: 02/26/2021 7:27 AM Reason for block: surgical anesthesia Staffing Performed: resident/CRNA  Anesthesiologist: [place] Ouch, MD Resident/CRNA: Lia Foyer, CRNA Preanesthetic Checklist Completed: patient identified, IV checked, site marked, risks and benefits discussed, surgical consent, monitors and equipment checked, pre-op evaluation and timeout performed Spinal Block Patient position: sitting Prep: ChloraPrep Patient monitoring: heart rate, cardiac monitor, continuous pulse ox and blood pressure Approach: midline Location: L3-4 Injection technique: single-shot Needle Needle type: Pencan  Needle gauge: 25 G Needle length: 9 cm Assessment Sensory level: T4 Events: CSF return

## 2021-02-26 NOTE — TOC Progression Note (Signed)
Transition of Care [facility]) - Progression Note    Patient Details  Name: Amy Wells MRN: [id number] Date of Birth: [birth year]  [facility] CM/SW [place], RN Phone Number: 02/26/2021, 1:44 PM  Clinical Narrative:   TOC acknowledges Consult, Awaiting PT eval notes to determine recommendation         Expected Discharge Plan and Services                                                 Social Determinants of Health (SDOH) Interventions    Readmission Risk Interventions No flowsheet data found.

## 2021-02-26 NOTE — Transfer of Care (Signed)
Immediate Anesthesia Transfer of Care Note  Patient: Amy Wells  Procedure(s) Performed: TOTAL HIP ARTHROPLASTY ANTERIOR APPROACH (Left: Hip)  Patient Location: PACU  Anesthesia Type:General and Spinal  Level of Consciousness: drowsy  Airway & Oxygen Therapy: Patient Spontanous Breathing  Post-op Assessment: Report given to RN and Post -op Vital signs reviewed and stable  Post vital signs: Reviewed and stable  Last Vitals:  Vitals Value Taken Time  BP 81/49 02/26/21 0845  Temp    Pulse 61 02/26/21 0847  Resp 16 02/26/21 0847  SpO2 99 % 02/26/21 0847  Vitals shown include unvalidated device data.  Last Pain:  Vitals:   02/26/21 0619  PainSc: 2          Complications: No notable events documented.

## 2021-02-26 NOTE — Anesthesia Postprocedure Evaluation (Signed)
Anesthesia Post Note  Patient: Amy Wells  Procedure(s) Performed: TOTAL HIP ARTHROPLASTY ANTERIOR APPROACH (Left: Hip)  Patient location during evaluation: PACU Anesthesia Type: Combined General/Spinal Level of consciousness: awake and alert Pain management: pain level controlled Vital Signs Assessment: post-procedure vital signs reviewed and stable Respiratory status: spontaneous breathing, nonlabored ventilation and respiratory function stable Cardiovascular status: blood pressure returned to baseline and stable Postop Assessment: no apparent nausea or vomiting and spinal receding Anesthetic complications: no   No notable events documented.   Last Vitals:  Vitals:   02/26/21 1200 02/26/21 1256  BP: (!) 97/51 (!) 100/44  Pulse: 61 65  Resp: 17 16  Temp:  (!) 36.3 C  SpO2: 100% 99%    Last Pain:  Vitals:   02/26/21 1256  TempSrc: Temporal  PainSc:                  Iran Ouch

## 2021-02-27 LAB — CBC
HCT: 34.1 % — ABNORMAL LOW (ref 36.0–46.0)
Hemoglobin: 11.4 g/dL — ABNORMAL LOW (ref 12.0–15.0)
MCH: 31.5 pg (ref 26.0–34.0)
MCHC: 33.4 g/dL (ref 30.0–36.0)
MCV: 94.2 fL (ref 80.0–100.0)
Platelets: 241 10*3/uL (ref 150–400)
RBC: 3.62 MIL/uL — ABNORMAL LOW (ref 3.87–5.11)
RDW: 12.3 % (ref 11.5–15.5)
WBC: 7.6 10*3/uL (ref 4.0–10.5)
nRBC: 0 % (ref 0.0–0.2)

## 2021-02-27 LAB — SURGICAL PATHOLOGY

## 2021-02-27 MED ORDER — ACETAMINOPHEN 325 MG PO TABS
ORAL_TABLET | ORAL | Status: AC
  Filled 2021-02-27: qty 2

## 2021-02-27 MED ORDER — OXYCODONE HCL 5 MG PO TABS
ORAL_TABLET | ORAL | Status: AC
  Filled 2021-02-27: qty 2

## 2021-02-27 MED ORDER — OXYCODONE HCL 5 MG PO TABS
ORAL_TABLET | ORAL | Status: AC
  Filled 2021-02-27: qty 1

## 2021-02-27 MED ORDER — ACETAMINOPHEN 325 MG PO TABS
ORAL_TABLET | ORAL | Status: AC
  Administered 2021-02-27: 650 mg via ORAL
  Filled 2021-02-27: qty 2

## 2021-02-27 MED ORDER — ONDANSETRON HCL 4 MG PO TABS
4.0000 mg | ORAL_TABLET | Freq: Four times a day (QID) | ORAL | 0 refills | Status: AC | PRN
Start: 2021-02-27 — End: ?

## 2021-02-27 MED ORDER — METHOCARBAMOL 500 MG PO TABS
500.0000 mg | ORAL_TABLET | Freq: Four times a day (QID) | ORAL | 0 refills | Status: AC | PRN
Start: 2021-02-27 — End: ?

## 2021-02-27 MED ORDER — METHOCARBAMOL 500 MG PO TABS
ORAL_TABLET | ORAL | Status: AC
  Filled 2021-02-27: qty 1

## 2021-02-27 MED ORDER — SCOPOLAMINE 1 MG/3DAYS TD PT72: 1.0000 | MEDICATED_PATCH | TRANSDERMAL | Status: DC

## 2021-02-27 MED ORDER — DOCUSATE SODIUM 100 MG PO CAPS
100.0000 mg | ORAL_CAPSULE | Freq: Two times a day (BID) | ORAL | 0 refills | Status: AC
Start: 2021-02-27 — End: ?

## 2021-02-27 MED ORDER — ENOXAPARIN SODIUM 40 MG/0.4ML IJ SOSY: 40.0000 mg | PREFILLED_SYRINGE | INTRAMUSCULAR | 0 refills | Status: AC

## 2021-02-27 MED ORDER — DOCUSATE SODIUM 100 MG PO CAPS
ORAL_CAPSULE | ORAL | Status: AC
  Administered 2021-02-27: 100 mg via ORAL
  Filled 2021-02-27: qty 1

## 2021-02-27 MED ORDER — OXYCODONE HCL 5 MG PO TABS
ORAL_TABLET | ORAL | Status: AC
  Administered 2021-02-27: 5 mg via ORAL
  Filled 2021-02-27: qty 2

## 2021-02-27 MED ORDER — ONDANSETRON HCL 4 MG/2ML IJ SOLN
INTRAMUSCULAR | Status: AC
  Filled 2021-02-27: qty 2

## 2021-02-27 MED ORDER — POLYETHYLENE GLYCOL 3350 17 G PO PACK
17.0000 g | PACK | Freq: Every day | ORAL | 0 refills | Status: AC | PRN
Start: 2021-02-27 — End: ?

## 2021-02-27 MED ORDER — ENOXAPARIN SODIUM 40 MG/0.4ML IJ SOSY
PREFILLED_SYRINGE | INTRAMUSCULAR | Status: AC
  Administered 2021-02-27: 40 mg via SUBCUTANEOUS
  Filled 2021-02-27: qty 0.4

## 2021-02-27 MED ORDER — OXYCODONE HCL 5 MG PO TABS
5.0000 mg | ORAL_TABLET | ORAL | 0 refills | Status: AC | PRN
Start: 2021-02-27 — End: ?

## 2021-02-27 NOTE — Discharge Summary (Signed)
Physician Discharge Summary  Patient ID: Amy Wells MRN: [id number] DOB/AGE: 1940/10/24 81 y.o.  Admit date: 02/26/2021 Discharge date: 02/27/2021  Admission Diagnoses:  Status post total hip replacement, left [Z96.642]   Discharge Diagnoses: Patient Active Problem List   Diagnosis Date Noted   Status post total hip replacement, left 02/26/2021    Past Medical History:  Diagnosis Date   Arthritis    Asthma    Chicken pox    Complication of anesthesia    bp dropped very low requiring over night stay   Dysrhythmia    Herpes simplex of eye    right eye    Hypothyroidism    Pneumonia      Transfusion: none   Consultants (if any):   Discharged Condition: Improved  Hospital Course: Amy Wells is an 81 y.o. female who was admitted 02/26/2021 with a diagnosis of Status post total hip replacement, left and went to the operating room on 02/26/2021 and underwent the above named procedures.    Surgeries: Procedure(s): TOTAL HIP ARTHROPLASTY ANTERIOR APPROACH on 02/26/2021 Patient tolerated the surgery well. Taken to PACU where she was stabilized and then transferred to the orthopedic floor.  Started on Lovenox 40 mg q 24 hrs. Foot pumps applied bilaterally at 80 mm. Heels elevated on bed with rolled towels. No evidence of DVT. Negative Homan. Physical therapy started on day #1 for gait training and transfer. OT started day #1 for ADL and assisted devices.  Patient's foley was d/c on day #1. Patient's IV and hemovac was d/c on day #1.  On post op day #1 patient was stable and ready for discharge to home with HHPT.  She was given perioperative antibiotics:  Anti-infectives (From admission, onward)    Start     Dose/Rate Route Frequency Ordered Stop   02/26/21 1402  ceFAZolin (ANCEF) 1-4 GM/50ML-% IVPB  Status:  Discontinued       Note to Pharmacy: Maryagnes Amos B: cabinet override      02/26/21 1402 02/26/21 1413   02/26/21 1330  ceFAZolin (ANCEF) IVPB 1 g/50 mL  premix        1 g 100 mL/hr over 30 Minutes Intravenous Every 6 hours 02/26/21 0935 02/27/21 0129   02/26/21 0630  ceFAZolin (ANCEF) IVPB 2g/100 mL premix        2 g 200 mL/hr over 30 Minutes Intravenous On call to O.R. 02/26/21 6606 02/26/21 0732   02/26/21 0630  ceFAZolin (ANCEF) 2-4 GM/100ML-% IVPB       Note to Pharmacy: Lyman Bishop T: cabinet override      02/26/21 0630 02/26/21 0735     .  She was given sequential compression devices, early ambulation, and Lovenox TEDs for DVT prophylaxis.  She benefited maximally from the hospital stay and there were no complications.    Recent vital signs:  Vitals:   02/27/21 0800 02/27/21 1149  BP: (!) 104/55 (!) 119/57  Pulse: 80 81  Resp: 16 16  Temp: 98.9 F (37.2 C) (!) 97 F (36.1 C)  SpO2: 98%     Recent laboratory studies:  Lab Results  Component Value Date   HGB 11.4 (L) 02/27/2021   HGB 11.2 (L) 02/26/2021   Lab Results  Component Value Date   WBC 7.6 02/27/2021   PLT 241 02/27/2021   No results found for: INR Lab Results  Component Value Date   CREATININE 0.56 02/26/2021    Discharge Medications:   Allergies as of 02/27/2021  Reactions   Meperidine Nausea Only   Demadex [torsemide]    Unknown reaction   Magnesium-containing Compounds Diarrhea   Codeine Anxiety   Minoxidil Rash        Medication List     STOP taking these medications    ibuprofen 200 MG tablet Commonly known as: ADVIL       TAKE these medications    acetaminophen 500 MG tablet Commonly known as: TYLENOL Take 1,000 mg by mouth every 6 (six) hours as needed for moderate pain.   Cholecalciferol 25 MCG (1000 UT) tablet Take 1,000 Units by mouth daily.   docusate sodium 100 MG capsule Commonly known as: COLACE Take 1 capsule (100 mg total) by mouth 2 (two) times daily.   enoxaparin 40 MG/0.4ML injection Commonly known as: LOVENOX Inject 0.4 mLs (40 mg total) into the skin daily for 14 days. Start taking on:  February 28, 2021   estradiol 0.1 MG/GM vaginal cream Commonly known as: ESTRACE Place 2 g vaginally 2 (two) times a week.   Fluticasone-Salmeterol 100-50 MCG/DOSE Aepb Commonly known as: ADVAIR Inhale 1 puff into the lungs 2 (two) times daily.   levocetirizine 5 MG tablet Commonly known as: XYZAL Take 5 mg by mouth every evening.   levothyroxine 50 MCG tablet Commonly known as: SYNTHROID Take 50 mcg by mouth daily before breakfast.   methocarbamol 500 MG tablet Commonly known as: ROBAXIN Take 1 tablet (500 mg total) by mouth every 6 (six) hours as needed for muscle spasms.   minoxidil 2.5 MG tablet Commonly known as: LONITEN Take 1 tablet (2.5 mg total) by mouth daily. Take 1/2 tablet by mouth daily   montelukast 10 MG tablet Commonly known as: SINGULAIR Take 10 mg by mouth at bedtime.   ondansetron 4 MG tablet Commonly known as: ZOFRAN Take 1 tablet (4 mg total) by mouth every 6 (six) hours as needed for nausea.   oxyCODONE 5 MG immediate release tablet Commonly known as: Oxy IR/ROXICODONE Take 1-2 tablets (5-10 mg total) by mouth every 4 (four) hours as needed for moderate pain.   polyethylene glycol 17 g packet Commonly known as: MIRALAX / GLYCOLAX Take 17 g by mouth daily as needed for mild constipation.   Potassium Gluconate 550 (90 K) MG Tabs Take 90 mg by mouth daily.   prednisoLONE acetate 1 % ophthalmic suspension Commonly known as: PRED FORTE Place 1 drop into the right eye daily.   Turmeric 400 MG Caps Take 400 mg by mouth daily.   VITAMIN B COMPLEX PO Take 1 tablet by mouth daily.   vitamin E 180 MG (400 UNITS) capsule Take 400 Units by mouth daily.               Durable Medical Equipment  (From admission, onward)           Start     Ordered   02/26/21 0935  DME Walker rolling  Once       Question Answer Comment  Walker: With 5 Inch Wheels   Patient needs a walker to treat with the following condition Status post total hip  replacement, left      02/26/21 0935   02/26/21 0935  DME 3 n 1  Once        02/26/21 0935   02/26/21 0935  DME Bedside commode  Once       Question:  Patient needs a bedside commode to treat with the following condition  Answer:  Status post total hip  replacement, left   02/26/21 0935            Diagnostic Studies: DG C-Arm 1-60 Min-No Report  Result Date: 02/26/2021 Fluoroscopy was utilized by the requesting physician.  No radiographic interpretation.   DG C-Arm 1-60 Min-No Report  Result Date: 02/26/2021 Fluoroscopy was utilized by the requesting physician.  No radiographic interpretation.   DG HIP UNILAT WITH PELVIS 1V LEFT  Result Date: 02/26/2021 CLINICAL DATA:  Left anterior hip replacement in progress EXAM: DG HIP (WITH OR WITHOUT PELVIS) 1V*L* COMPARISON:  None. FINDINGS: A total of 4 intraoperative saved images are submitted for review. The images demonstrate in progress anterior approach left hip hemiarthroplasty. No evidence of immediate hardware complication. IMPRESSION: Anterior approach left hip hemiarthroplasty in progress without evidence of immediate hardware complication. Electronically Signed   By: Jacqulynn Cadet M.D.   On: 02/26/2021 08:51   DG HIP UNILAT W OR W/O PELVIS 2-3 VIEWS LEFT  Result Date: 02/26/2021 CLINICAL DATA:  Status post left total hip replacement EXAM: DG HIP (WITH OR WITHOUT PELVIS) 2V LEFT COMPARISON:  None. FINDINGS: Interval postsurgical changes from left total hip arthroplasty. Arthroplasty components appear in their expected alignment. No periprosthetic fracture is identified. Expected postoperative changes within the overlying soft tissues. IMPRESSION: Expected postsurgical changes of left total hip arthroplasty. Electronically Signed   By: Yetta Glassman M.D.   On: 02/26/2021 09:08    Disposition:      Follow-up Information     Duanne Guess, PA-C Follow up in 2 week(s).   Specialties: Orthopedic Surgery, Emergency  Medicine Contact information: [place] [address] [phone]                  Signed: Feliberto Gottron 02/27/2021, 1:31 PM

## 2021-02-27 NOTE — Progress Notes (Signed)
Met with the patient and her family at the bedside to discuss DC plan and needs She lives at home with her husband, has transportation and can afford her medication She needs 3 in 1 and rolling walker, [employer] will deliver to the room prior to DC, [facility] accepted her as a patient

## 2021-02-27 NOTE — Progress Notes (Signed)
Physical Therapy Treatment Patient Details Name: Amy Wells MRN: [id number] DOB: May 21, 1940 Today's Date: 02/27/2021   History of Present Illness admitted for acute hospitalization s/p L THA, WBAT (02/26/21)    PT Comments    Pt was long sitting in bed with supportive daughter at bedside. Spouse arrived during session. Pt was severely limited by having vomiting episode during gait training." My pain is much better but now I am nauseous." Pt required min assist to exit bed however once seated EOB, was able to stand and ambulate with supervision. She will have assistance at DC. Author will return around 42 to perform stair training in prep for DC home. PT recommends continued skilled PT at DC to address deficits while assisting pt to PLOF.  RN in room giving anti-nausea medication at conclusion of session.    Recommendations for follow up therapy are one component of a multi-disciplinary discharge planning process, led by the attending physician.  Recommendations may be updated based on patient status, additional functional criteria and insurance authorization.  Follow Up Recommendations  Home health PT     Assistance Recommended at Discharge PRN  Patient can return home with the following A little help with walking and/or transfers;A little help with bathing/dressing/bathroom   Equipment Recommendations  Rolling walker (2 wheels);BSC/3in1       Precautions / Restrictions Precautions Precautions: Fall;Anterior Hip Precaution Booklet Issued: No Restrictions Weight Bearing Restrictions: Yes LLE Weight Bearing: Weight bearing as tolerated     Mobility  Bed Mobility Overal bed mobility: Needs Assistance Bed Mobility: Supine to Sit     Supine to sit: Min assist     General bed mobility comments: Pt required min assist to properlty exit R side of bed. will have assistance at DC to assist with this.    Transfers Overall transfer level: Needs assistance Equipment used:  Rolling walker (2 wheels) Transfers: Sit to/from Stand Sit to Stand: Supervision           General transfer comment: pt was able to STS from EOB without physical assistance. vcs for technique improvements only    Ambulation/Gait Ambulation/Gait assistance: Supervision Gait Distance (Feet): 15 Feet Assistive device: Rolling walker (2 wheels) Gait Pattern/deviations: Step-through pattern;Antalgic Gait velocity: decreased     General Gait Details: disance limited by pt vomiting. Pt did well without LOB or assistance. N & V most limiting.   Stairs Stairs:  (will address stairs later this morning)           Balance Overall balance assessment: Needs assistance Sitting-balance support: No upper extremity supported;Feet supported Sitting balance-Leahy Scale: Good     Standing balance support: Bilateral upper extremity supported Standing balance-Leahy Scale: Fair      Cognition Arousal/Alertness: Awake/alert Behavior During Therapy: WFL for tasks assessed/performed Overall Cognitive Status: Within Functional Limits for tasks assessed      General Comments: Pt is A and O x 4           General Comments General comments (skin integrity, edema, etc.): will issue HEP in 2nd session      Pertinent Vitals/Pain Pain Assessment: 0-10 Pain Score: 4  Pain Location: L hip Pain Descriptors / Indicators: Aching;Grimacing;Guarding Pain Intervention(s): Limited activity within patient's tolerance;Monitored during session;Premedicated before session;Repositioned;Ice applied     PT Goals (current goals can now be found in the care plan section) Acute Rehab PT Goals Patient Stated Goal: to return home Progress towards PT goals: Progressing toward goals    Frequency    BID  PT Plan Current plan remains appropriate       AM-PAC PT "6 Clicks" Mobility   Outcome Measure  Help needed turning from your back to your side while in a flat bed without using bedrails?:  None Help needed moving from lying on your back to sitting on the side of a flat bed without using bedrails?: A Little Help needed moving to and from a bed to a chair (including a wheelchair)?: A Little Help needed standing up from a chair using your arms (e.g., wheelchair or bedside chair)?: A Little Help needed to walk in hospital room?: A Little Help needed climbing 3-5 steps with a railing? : A Little 6 Click Score: 19    End of Session   Activity Tolerance: Patient tolerated treatment well Patient left: in chair;with call bell/phone within reach;with family/visitor present;with nursing/sitter in room Nurse Communication: Mobility status PT Visit Diagnosis: Muscle weakness (generalized) (M62.81);Difficulty in walking, not elsewhere classified (R26.2)     Time: 6761-9509 PT Time Calculation (min) (ACUTE ONLY): 24 min  Charges:  $Gait Training: 8-22 mins $Therapeutic Activity: 8-22 mins                     Julaine Fusi PTA 02/27/21, 9:51 AM

## 2021-02-27 NOTE — Progress Notes (Signed)
PT Cancellation Note  Patient Details Name: Amy Wells MRN: [id number] DOB: 1941-01-29   Cancelled Treatment:     Pt just finished working with OT and per therapist will benefit from a little more time prior to working with PT again. Author will return after lunch as requested.    Willette Pa 02/27/2021, 10:51 AM

## 2021-02-27 NOTE — Evaluation (Signed)
Occupational Therapy Evaluation Patient Details Name: Amy Wells MRN: [id number] DOB: [birth year] Today's Date: 02/27/2021   History of Present Illness admitted for acute hospitalization s/p L THA, WBAT (02/26/21)   Clinical Impression   Patient presenting with decreased Ind in self care,balance, functional mobility/transfers, endurance, and safety awareness. Patient reports being independent in all aspects of care and mobility PTA. Pt limited this session secondary to nausea and reporting 9/10 pain during evaluation and calling for pain medication. Staff reporting pt needing supervision - min A for functional mobility with use of RW. OT anticipates pt will need min A for LB self care. OT educated pt on how to increase Ind with LB self care, increasing safety with mobility at home, and answering further questions related to what to expect. Patient will benefit from acute OT to increase overall independence in the areas of ADLs, functional mobility, and safety awareness in order to safely discharge home with family.      Recommendations for follow up therapy are one component of a multi-disciplinary discharge planning process, led by the attending physician.  Recommendations may be updated based on patient status, additional functional criteria and insurance authorization.   Follow Up Recommendations  No OT follow up    Assistance Recommended at Discharge Intermittent Supervision/Assistance  Patient can return home with the following A little help with walking and/or transfers;A little help with bathing/dressing/bathroom    Functional Status Assessment  Patient has had a recent decline in their functional status and demonstrates the ability to make significant improvements in function in a reasonable and predictable amount of time.  Equipment Recommendations  BSC/3in1;Other (comment) (RW)       Precautions / Restrictions Precautions Precautions: Fall Precaution Booklet Issued:  No Restrictions Weight Bearing Restrictions: Yes LLE Weight Bearing: Weight bearing as tolerated      Mobility Bed Mobility Overal bed mobility: Needs Assistance Bed Mobility: Supine to Sit     Supine to sit: Min assist     General bed mobility comments: Pt required min assist to properlty exit R side of bed. will have assistance at DC to assist with this.    Transfers Overall transfer level: Needs assistance Equipment used: Rolling walker (2 wheels) Transfers: Sit to/from Stand Sit to Stand: Supervision           General transfer comment: pt was able to STS from EOB without physical assistance. vcs for technique improvements only      Balance Overall balance assessment: Needs assistance Sitting-balance support: No upper extremity supported;Feet supported Sitting balance-Leahy Scale: Good     Standing balance support: Bilateral upper extremity supported Standing balance-Leahy Scale: Fair                             ADL either performed or assessed with clinical judgement   ADL Overall ADL's : Needs assistance/impaired                                       General ADL Comments: limited evaluation secondary to pt experiencing sharp shooting pain and nausea. Pt likely needs min A for LB clothing management of L LE.     Vision   Vision Assessment?: No apparent visual deficits            Pertinent Vitals/Pain Pain Assessment: 0-10 Pain Score: 9  Pain Location: L hip Pain Descriptors /  Indicators: Aching;Grimacing;Guarding;Sharp;Shooting Pain Intervention(s): Limited activity within patient's tolerance;Monitored during session;Premedicated before session;Patient requesting pain meds-RN notified        Extremity/Trunk Assessment Upper Extremity Assessment Upper Extremity Assessment: Overall WFL for tasks assessed   Lower Extremity Assessment Lower Extremity Assessment: Defer to PT evaluation          Cognition  Arousal/Alertness: Awake/alert Behavior During Therapy: WFL for tasks assessed/performed Overall Cognitive Status: Within Functional Limits for tasks assessed                                 General Comments: Pt is A and O x 4     General Comments  will issue HEP in 2nd session    Exercises Exercises: Total Joint        Home Living Family/patient expects to be discharged to:: Private residence Living Arrangements: Spouse/significant other Available Help at Discharge: Family Type of Home: House Home Access: Stairs to enter [employer] of Steps: 2 Entrance Stairs-Rails: Right Home Layout: Two level;Able to live on main level with bedroom/bathroom     Bathroom Shower/Tub: Walk-in shower         Home Equipment: Shower seat          Prior Functioning/Environment Prior Level of Function : Independent/Modified Independent             Mobility Comments: Indep with ADls, household and community mobilization without assist device; denies recent fall history; + driving; active, enjoys hiking with husband. ADLs Comments: Ind        OT Problem List: Decreased strength;Decreased range of motion;Decreased activity tolerance;Impaired balance (sitting and/or standing);Decreased safety awareness;Pain;Decreased knowledge of use of DME or AE      OT Treatment/Interventions: Self-care/ADL training;Therapeutic exercise;Patient/family education;Balance training;Energy conservation;Therapeutic activities;DME and/or AE instruction    OT Goals(Current goals can be found in the care plan section) Acute Rehab OT Goals Patient Stated Goal: to go home today and decrease pain OT Goal Formulation: With patient/family Time For Goal Achievement: 03/13/21 Potential to Achieve Goals: Good ADL Goals Pt Will Perform Grooming: with modified independence;standing Pt Will Perform Lower Body Dressing: with modified independence;with adaptive equipment;sit to/from  stand Pt Will Transfer to Toilet: with modified independence;ambulating Pt Will Perform Toileting - Clothing Manipulation and hygiene: with modified independence;sit to/from stand  OT Frequency: Min 2X/week       AM-PAC OT "6 Clicks" Daily Activity     Outcome Measure Help from another person eating meals?: None Help from another person taking care of personal grooming?: None Help from another person toileting, which includes using toliet, bedpan, or urinal?: A Little Help from another person bathing (including washing, rinsing, drying)?: A Little Help from another person to put on and taking off regular upper body clothing?: None Help from another person to put on and taking off regular lower body clothing?: A Little 6 Click Score: 21   End of Session Nurse Communication: Patient requests pain meds  Activity Tolerance: Patient limited by pain Patient left: in bed;with call bell/phone within reach;with bed alarm set;with family/visitor present  OT Visit Diagnosis: Unsteadiness on feet (R26.81);Muscle weakness (generalized) (M62.81);Pain Pain - Right/Left: Left Pain - part of body: Hip                Time: 1030-1046 OT Time Calculation (min): 16 min Charges:  OT General Charges $OT Visit: 1 Visit OT Evaluation $OT Eval Low Complexity: 1 Low  Darleen Crocker, MS,  OTR/L , CBIS ascom [phone]  02/27/21, 1:37 PM

## 2021-02-27 NOTE — Progress Notes (Signed)
° °  Subjective: 1 Day Post-Op Procedure(s) (LRB): TOTAL HIP ARTHROPLASTY ANTERIOR APPROACH (Left) Patient reports pain as mild and moderate.   Patient is doing well but having some complaints of nausea Denies any CP, SOB, ABD pain. We will continue therapy today.  Plan is to go Home after hospital stay.  Objective: Vital signs in last 24 hours: Temp:  [97 F (36.1 C)-98.9 F (37.2 C)] 97 F (36.1 C) (01/04 1149) Pulse Rate:  [80-85] 81 (01/04 1149) Resp:  [16-17] 16 (01/04 1149) BP: (104-125)/(50-62) 119/57 (01/04 1149) SpO2:  [96 %-98 %] 98 % (01/04 0800)  Intake/Output from previous day: 01/03 0701 - 01/04 0700 In: 1600 [I.V.:1400; IV Piggyback:200] Out: 2250 [Urine:2225; Blood:25] Intake/Output this shift: Total I/O In: 60 [P.O.:60] Out: -   Recent Labs    02/26/21 1051 02/27/21 0449  HGB 11.2* 11.4*   Recent Labs    02/26/21 1051 02/27/21 0449  WBC 8.8 7.6  RBC 3.55* 3.62*  HCT 34.3* 34.1*  PLT 242 241   Recent Labs    02/26/21 1051  CREATININE 0.56   No results for input(s): LABPT, INR in the last 72 hours.  EXAM General - Patient is Alert, Appropriate, and Oriented Extremity - Sensation intact distally Intact pulses distally Dorsiflexion/Plantar flexion intact No cellulitis present Compartment soft Dressing - dressing C/D/I and no drainage, Prevena intact without drainage Motor Function - intact, moving foot and toes well on exam.   Past Medical History:  Diagnosis Date   Arthritis    Asthma    Chicken pox    Complication of anesthesia    bp dropped very low requiring over night stay   Dysrhythmia    Herpes simplex of eye    right eye    Hypothyroidism    Pneumonia     Assessment/Plan:   1 Day Post-Op Procedure(s) (LRB): TOTAL HIP ARTHROPLASTY ANTERIOR APPROACH (Left) Principal Problem:   Status post total hip replacement, left  Estimated body mass index is 22.28 kg/m as calculated from the following:   Height as of this  encounter: 5\' 6"  (1.676 m).   Weight as of this encounter: 62.6 kg. Advance diet Up with therapy Labs are stable VSS Scopolamine ordered for nausea CM to assist with discharge to home with HHPT   DVT Prophylaxis - Lovenox, TED hose, and SCDs Weight-Bearing as tolerated to left leg   T. Rachelle Hora, PA-C [place] 02/27/2021, 1:14 PM

## 2021-02-27 NOTE — Progress Notes (Signed)
Discharge instructions, medication details and wound care reviewed with patient and husband.  Patient verbalized desire to be discharged to her home.  Patient to be transported via personal car to her home with husband and daughter to provide care as directed by physician.

## 2021-02-27 NOTE — Discharge Instructions (Addendum)
ANTERIOR APPROACH TOTAL HIP REPLACEMENT POSTOPERATIVE DIRECTIONS   Hip Rehabilitation, Guidelines Following Surgery  The results of a hip operation are greatly improved after range of motion and muscle strengthening exercises. Follow all safety measures which are given to protect your hip. If any of these exercises cause increased pain or swelling in your joint, decrease the amount until you are comfortable again. Then slowly increase the exercises. Call your caregiver if you have problems or questions.   HOME CARE INSTRUCTIONS  Remove items at home which could result in a fall. This includes throw rugs or furniture in walking pathways.  ICE to the affected hip every three hours for 30 minutes at a time and then as needed for pain and swelling.  Continue to use ice on the hip for pain and swelling from surgery. You may notice swelling that will progress down to the foot and ankle.  This is normal after surgery.  Elevate the leg when you are not up walking on it.   Continue to use the breathing machine which will help keep your temperature down.  It is common for your temperature to cycle up and down following surgery, especially at night when you are not up moving around and exerting yourself.  The breathing machine keeps your lungs expanded and your temperature down. Do not place pillow under knee, focus on keeping the knee straight while resting  DIET You may resume your previous home diet once your are discharged from the hospital.  DRESSING / WOUND CARE / SHOWERING Please remove provena negative pressure dressing on 03/06/2021 and apply honey comb dressing. Keep dressing clean and dry at all times.   ACTIVITY Walk with your walker as instructed. Use walker as long as suggested by your caregivers. Avoid periods of inactivity such as sitting longer than an hour when not asleep. This helps prevent blood clots.  You may resume a sexual relationship in one month or when given the OK by your  doctor.  You may return to work once you are cleared by your doctor.  Do not drive a car for 6 weeks or until released by you surgeon.  Do not drive while taking narcotics.  WEIGHT BEARING Weight bearing as tolerated. Use walker/cane as needed for at least 4 weeks post op.  POSTOPERATIVE CONSTIPATION PROTOCOL Constipation - defined medically as fewer than three stools per week and severe constipation as less than one stool per week.  One of the most common issues patients have following surgery is constipation.  Even if you have a regular bowel pattern at home, your normal regimen is likely to be disrupted due to multiple reasons following surgery.  Combination of anesthesia, postoperative narcotics, change in appetite and fluid intake all can affect your bowels.  In order to avoid complications following surgery, here are some recommendations in order to help you during your recovery period.  Colace (docusate) - Pick up an over-the-counter form of Colace or another stool softener and take twice a day as long as you are requiring postoperative pain medications.  Take with a full glass of water daily.  If you experience loose stools or diarrhea, hold the colace until you stool forms back up.  If your symptoms do not get better within 1 week or if they get worse, check with your doctor.  Dulcolax (bisacodyl) - Pick up over-the-counter and take as directed by the product packaging as needed to assist with the movement of your bowels.  Take with a full glass of  water.  Use this product as needed if not relieved by Colace only.  ° °MiraLax (polyethylene glycol) - Pick up over-the-counter to have on hand.  MiraLax is a solution that will increase the amount of water in your bowels to assist with bowel movements.  Take as directed and can mix with a glass of water, juice, soda, coffee, or tea.  Take if you go more than two days without a movement. °Do not use MiraLax more than once per day. Call your doctor  if you are still constipated or irregular after using this medication for 7 days in a row. ° °If you continue to have problems with postoperative constipation, please contact the office for further assistance and recommendations.  If you experience "the worst abdominal pain ever" or develop nausea or vomiting, please contact the office immediatly for further recommendations for treatment. ° °ITCHING ° If you experience itching with your medications, try taking only a single pain pill, or even half a pain pill at a time.  You can also use Benadryl over the counter for itching or also to help with sleep.  ° °TED HOSE STOCKINGS °Wear the elastic stockings on both legs for six weeks following surgery during the day but you may remove then at night for sleeping. ° °MEDICATIONS °See your medication summary on the “After Visit Summary” that the nursing staff will review with you prior to discharge.  You may have some home medications which will be placed on hold until you complete the course of blood thinner medication.  It is important for you to complete the blood thinner medication as prescribed by your surgeon.  Continue your approved medications as instructed at time of discharge. ° °PRECAUTIONS °If you experience chest pain or shortness of breath - call 911 immediately for transfer to the hospital emergency department.  °If you develop a fever greater that 101 F, purulent drainage from wound, increased redness or drainage from wound, foul odor from the wound/dressing, or calf pain - CONTACT YOUR SURGEON.   °                                                °FOLLOW-UP APPOINTMENTS °Make sure you keep all of your appointments after your operation with your surgeon and caregivers. You should call the office at the above phone number and make an appointment for approximately two weeks after the date of your surgery or on the date instructed by your surgeon outlined in the "After Visit Summary". ° °RANGE OF MOTION AND  STRENGTHENING EXERCISES  °These exercises are designed to help you keep full movement of your hip joint. Follow your caregiver's or physical therapist's instructions. Perform all exercises about fifteen times, three times per day or as directed. Exercise both hips, even if you have had only one joint replacement. These exercises can be done on a training (exercise) mat, on the floor, on a table or on a bed. Use whatever works the best and is most comfortable for you. Use music or television while you are exercising so that the exercises are a pleasant break in your day. This will make your life better with the exercises acting as a break in routine you can look forward to.  °Lying on your back, slowly slide your foot toward your buttocks, raising your knee up off the floor. Then slowly slide your   foot back down until your leg is straight again.  Lying on your back spread your legs as far apart as you can without causing discomfort.  Lying on your side, raise your upper leg and foot straight up from the floor as far as is comfortable. Slowly lower the leg and repeat.  Lying on your back, tighten up the muscle in the front of your thigh (quadriceps muscles). You can do this by keeping your leg straight and trying to raise your heel off the floor. This helps strengthen the largest muscle supporting your knee.  Lying on your back, tighten up the muscles of your buttocks both with the legs straight and with the knee bent at a comfortable angle while keeping your heel on the floor.   IF YOU ARE TRANSFERRED TO A SKILLED REHAB FACILITY If the patient is transferred to a skilled rehab facility following release from the hospital, a list of the current medications will be sent to the facility for the patient to continue.  When discharged from the skilled rehab facility, please have the facility set up the patient's [place] prior to being released. Also, the skilled facility will be responsible for  providing the patient with their medications at time of release from the facility to include their pain medication, the muscle relaxants, and their blood thinner medication. If the patient is still at the rehab facility at time of the two week follow up appointment, the skilled rehab facility will also need to assist the patient in arranging follow up appointment in our office and any transportation needs.  MAKE SURE YOU:  Understand these instructions.  Get help right away if you are not doing well or get worse.    Pick up stool softner and laxative for home use following surgery while on pain medications. Continue to use ice for pain and swelling after surgery. Do not use any lotions or creams on the incision until instructed by your surgeon. Information for Discharge Teaching:  EXPAREL (bupivacaine liposome injectable suspension) DO NOT REMOVE TEAL BRACELET FOR 4 DAYS (96 hours) 03/02/2021   Your surgeon or anesthesiologist gave you EXPAREL(bupivacaine) to help control your pain after surgery.  EXPAREL is a local anesthetic that provides pain relief by numbing the tissue around the surgical site. EXPAREL is designed to release pain medication over time and can control pain for up to 72 hours. Depending on how you respond to EXPAREL, you may require less pain medication during your recovery.  Possible side effects: Temporary loss of sensation or ability to move in the area where bupivacaine was injected. Nausea, vomiting, constipation Rarely, numbness and tingling in your mouth or lips, lightheadedness, or anxiety may occur. Call your doctor right away if you think you may be experiencing any of these sensations, or if you have other questions regarding possible side effects.  Follow all other discharge instructions given to you by your surgeon or nurse. Eat a healthy diet and drink plenty of water or other fluids.  If you return to the hospital for any reason within 96 hours following  the administration of EXPAREL, it is important for health care providers to know that you have received this anesthetic. A teal colored band has been placed on your arm with the date, time and amount of EXPAREL you have received in order to alert and inform your health care providers. Please leave this armband in place for the full 96 hours following administration, and then you may remove the band.

## 2021-02-28 DIAGNOSIS — E782 Mixed hyperlipidemia: Secondary | ICD-10-CM | POA: Diagnosis not present

## 2021-02-28 DIAGNOSIS — Z7951 Long term (current) use of inhaled steroids: Secondary | ICD-10-CM | POA: Diagnosis not present

## 2021-02-28 DIAGNOSIS — Z90710 Acquired absence of both cervix and uterus: Secondary | ICD-10-CM | POA: Diagnosis not present

## 2021-02-28 DIAGNOSIS — Z9181 History of falling: Secondary | ICD-10-CM | POA: Diagnosis not present

## 2021-02-28 DIAGNOSIS — E039 Hypothyroidism, unspecified: Secondary | ICD-10-CM | POA: Diagnosis not present

## 2021-02-28 DIAGNOSIS — J45909 Unspecified asthma, uncomplicated: Secondary | ICD-10-CM | POA: Diagnosis not present

## 2021-02-28 DIAGNOSIS — H269 Unspecified cataract: Secondary | ICD-10-CM | POA: Diagnosis not present

## 2021-02-28 DIAGNOSIS — M1990 Unspecified osteoarthritis, unspecified site: Secondary | ICD-10-CM | POA: Diagnosis not present

## 2021-02-28 DIAGNOSIS — Z471 Aftercare following joint replacement surgery: Secondary | ICD-10-CM | POA: Diagnosis not present

## 2021-02-28 DIAGNOSIS — Z79899 Other long term (current) drug therapy: Secondary | ICD-10-CM | POA: Diagnosis not present

## 2021-02-28 DIAGNOSIS — R32 Unspecified urinary incontinence: Secondary | ICD-10-CM | POA: Diagnosis not present

## 2021-02-28 DIAGNOSIS — Z96642 Presence of left artificial hip joint: Secondary | ICD-10-CM | POA: Diagnosis not present

## 2021-02-28 DIAGNOSIS — Z8701 Personal history of pneumonia (recurrent): Secondary | ICD-10-CM | POA: Diagnosis not present

## 2021-02-28 DIAGNOSIS — Z9841 Cataract extraction status, right eye: Secondary | ICD-10-CM | POA: Diagnosis not present

## 2021-03-05 DIAGNOSIS — D500 Iron deficiency anemia secondary to blood loss (chronic): Secondary | ICD-10-CM | POA: Diagnosis not present

## 2021-03-05 DIAGNOSIS — Z96642 Presence of left artificial hip joint: Secondary | ICD-10-CM | POA: Diagnosis not present

## 2021-03-06 DIAGNOSIS — Z471 Aftercare following joint replacement surgery: Secondary | ICD-10-CM | POA: Diagnosis not present

## 2021-03-06 DIAGNOSIS — M1990 Unspecified osteoarthritis, unspecified site: Secondary | ICD-10-CM | POA: Diagnosis not present

## 2021-03-06 DIAGNOSIS — J45909 Unspecified asthma, uncomplicated: Secondary | ICD-10-CM | POA: Diagnosis not present

## 2021-03-06 DIAGNOSIS — E039 Hypothyroidism, unspecified: Secondary | ICD-10-CM | POA: Diagnosis not present

## 2021-03-06 DIAGNOSIS — Z96642 Presence of left artificial hip joint: Secondary | ICD-10-CM | POA: Diagnosis not present

## 2021-03-06 DIAGNOSIS — E782 Mixed hyperlipidemia: Secondary | ICD-10-CM | POA: Diagnosis not present

## 2021-03-06 DIAGNOSIS — R32 Unspecified urinary incontinence: Secondary | ICD-10-CM | POA: Diagnosis not present

## 2021-03-29 DIAGNOSIS — H269 Unspecified cataract: Secondary | ICD-10-CM | POA: Diagnosis not present

## 2021-03-29 DIAGNOSIS — M1990 Unspecified osteoarthritis, unspecified site: Secondary | ICD-10-CM | POA: Diagnosis not present

## 2021-03-29 DIAGNOSIS — Z9181 History of falling: Secondary | ICD-10-CM | POA: Diagnosis not present

## 2021-03-29 DIAGNOSIS — Z7951 Long term (current) use of inhaled steroids: Secondary | ICD-10-CM | POA: Diagnosis not present

## 2021-03-29 DIAGNOSIS — Z471 Aftercare following joint replacement surgery: Secondary | ICD-10-CM | POA: Diagnosis not present

## 2021-03-29 DIAGNOSIS — Z96642 Presence of left artificial hip joint: Secondary | ICD-10-CM | POA: Diagnosis not present

## 2021-03-29 DIAGNOSIS — Z90710 Acquired absence of both cervix and uterus: Secondary | ICD-10-CM | POA: Diagnosis not present

## 2021-03-29 DIAGNOSIS — Z79899 Other long term (current) drug therapy: Secondary | ICD-10-CM | POA: Diagnosis not present

## 2021-03-29 DIAGNOSIS — E782 Mixed hyperlipidemia: Secondary | ICD-10-CM | POA: Diagnosis not present

## 2021-03-29 DIAGNOSIS — Z9841 Cataract extraction status, right eye: Secondary | ICD-10-CM | POA: Diagnosis not present

## 2021-03-29 DIAGNOSIS — E039 Hypothyroidism, unspecified: Secondary | ICD-10-CM | POA: Diagnosis not present

## 2021-03-29 DIAGNOSIS — Z8701 Personal history of pneumonia (recurrent): Secondary | ICD-10-CM | POA: Diagnosis not present

## 2021-03-29 DIAGNOSIS — R32 Unspecified urinary incontinence: Secondary | ICD-10-CM | POA: Diagnosis not present

## 2021-03-29 DIAGNOSIS — J45909 Unspecified asthma, uncomplicated: Secondary | ICD-10-CM | POA: Diagnosis not present

## 2021-04-10 DIAGNOSIS — D500 Iron deficiency anemia secondary to blood loss (chronic): Secondary | ICD-10-CM | POA: Diagnosis not present

## 2021-04-10 DIAGNOSIS — M1612 Unilateral primary osteoarthritis, left hip: Secondary | ICD-10-CM | POA: Diagnosis not present

## 2021-04-15 ENCOUNTER — Ambulatory Visit: Payer: PPO | Admitting: Dermatology

## 2021-04-15 ENCOUNTER — Other Ambulatory Visit: Payer: Self-pay

## 2021-04-15 DIAGNOSIS — L905 Scar conditions and fibrosis of skin: Secondary | ICD-10-CM

## 2021-04-15 DIAGNOSIS — L650 Telogen effluvium: Secondary | ICD-10-CM

## 2021-04-15 NOTE — Patient Instructions (Addendum)
Recommend minoxidil 5% (Rogaine for men) solution or foam to be applied to the scalp and left in. This should ideally be used twice daily for best results but it helps with hair regrowth when used at least three times per week. Rogaine initially can cause increased hair shedding for the first few weeks but this will stop with continued use. In studies, people who used minoxidil (Rogaine) for at least 6 months had thicker hair than people who did not. Minoxidil topical (Rogaine) only works as long as it continues to be used. If if it is no longer used then the hair it has been helping to regrow can fall out. Minoxidil topical (Rogaine) can cause increased facial hair growth which can usually be managed easily with a battery-operated hair trimmer. If facial hair growth is bothersome, switching to the 2% women's version can decrease the risk of unwanted facial hair growth.    If You Need Anything After Your Visit  If you have any questions or concerns for your doctor, please call our main line at [phone] and press option 4 to reach your doctor's medical assistant. If no one answers, please leave a voicemail as directed and we will return your call as soon as possible. Messages left after 4 pm will be answered the following business day.   You may also send [place] a message via [place]. We typically respond to MyChart messages within 1-2 business days.  For prescription refills, please ask your pharmacy to contact our office. Our fax number is [phone].  If you have an urgent issue when the clinic is closed that cannot wait until the next business day, you can page your doctor at the number below.    Please note that while we do our best to be available for urgent issues outside of office hours, we are not available 24/7.   If you have an urgent issue and are unable to reach [place], you may choose to seek medical care at your doctor's office, retail clinic, urgent care center, or emergency room.  If  you have a medical emergency, please immediately call 911 or go to the emergency department.  Pager Numbers  - Dr. Nehemiah Massed: [phone]  - Dr. Laurence Ferrari: [phone]  - Dr. Nicole Kindred: [phone]  In the event of inclement weather, please call our main line at [phone] for an update on the status of any delays or closures.  Dermatology Medication Tips: Please keep the boxes that topical medications come in in order to help keep track of the instructions about where and how to use these. Pharmacies typically print the medication instructions only on the boxes and not directly on the medication tubes.   If your medication is too expensive, please contact our office at [phone] option 4 or send [place] a message through [place].   We are unable to tell what your co-pay for medications will be in advance as this is different depending on your insurance coverage. However, we may be able to find a substitute medication at lower cost or fill out paperwork to get insurance to cover a needed medication.   If a prior authorization is required to get your medication covered by your insurance company, please allow [place] 1-2 business days to complete this process.  Drug prices often vary depending on where the prescription is filled and some pharmacies may offer cheaper prices.  The website www.goodrx.com contains coupons for medications through different pharmacies. The prices here do not account for what the cost may be with  help from insurance (it may be cheaper with your insurance), but the website can give you the price if you did not use any insurance.  - You can print the associated coupon and take it with your prescription to the pharmacy.  - You may also stop by our office during regular business hours and pick up a GoodRx coupon card.  - If you need your prescription sent electronically to a different pharmacy, notify our office through [facility] or by phone at [phone] option  4.     Si Usted Necesita Algo Despus de Su Visita  Tambin puede enviarnos un mensaje a travs de Pharmacist, community. Por lo general respondemos a los mensajes de MyChart en el transcurso de 1 a 2 das hbiles.  Para renovar recetas, por favor pida a su farmacia que se ponga en contacto con nuestra oficina. Harland Dingwall de fax es [place] [phone].  Si tiene un asunto urgente cuando la clnica est cerrada y que no puede esperar hasta el siguiente da hbil, puede llamar/localizar a su doctor(a) al nmero que aparece a continuacin.   Por favor, tenga en cuenta que aunque hacemos todo lo posible para estar disponibles para asuntos urgentes fuera del horario de [place], no estamos disponibles las 24 horas del da, los 7 das de la [place].   Si tiene un problema urgente y no puede comunicarse con nosotros, puede optar por buscar atencin mdica  en el consultorio de su doctor(a), en una clnica privada, en un centro de atencin urgente o en una sala de emergencias.  Si tiene Engineering geologist, por favor llame inmediatamente al 911 o vaya a la sala de emergencias.  Nmeros de bper  - Dr. Nehemiah Massed: [phone]  - Dra. Moye: [phone]  - Dra. Nicole Kindred: [phone]  En caso de inclemencias del [place], por favor llame a Johnsie Kindred principal al [phone] para una actualizacin sobre el [place] de cualquier retraso o cierre.  Consejos para la medicacin en dermatologa: Por favor, guarde las cajas en las que vienen los medicamentos de uso tpico para ayudarle a seguir las instrucciones sobre dnde y cmo usarlos. Las farmacias generalmente imprimen las instrucciones del medicamento slo en las cajas y no directamente en los tubos del [place].   Si su medicamento es muy caro, por favor, pngase en contacto con Zigmund Daniel llamando al [phone] y presione la opcin 4 o envenos un mensaje a travs de Pharmacist, community.   No podemos decirle cul ser su copago por los medicamentos por  adelantado ya que esto es diferente dependiendo de la cobertura de su seguro. Sin embargo, es posible que podamos encontrar un medicamento sustituto a Electrical engineer un formulario para que el seguro cubra el medicamento que se considera necesario.   Si se requiere una autorizacin previa para que su compaa de seguros [place] su medicamento, por favor permtanos de 1 a 2 das hbiles para completar este proceso.  Los precios de los medicamentos varan con frecuencia dependiendo del Environmental consultant de dnde se surte la receta y alguna farmacias pueden ofrecer precios ms baratos.  El sitio web www.goodrx.com tiene cupones para medicamentos de Airline pilot. Los precios aqu no tienen en cuenta lo que podra costar con la ayuda del seguro (puede ser ms barato con su seguro), pero el sitio web puede darle el precio si no utiliz Research scientist (physical sciences).  - Puede imprimir el cupn correspondiente y llevarlo con su receta a la farmacia.  - Tambin puede pasar por nuestra oficina durante el  horario de Freight forwarder regular y Charity fundraiser una tarjeta de cupones de GoodRx.  - Si necesita que su receta se enve electrnicamente a una farmacia diferente, informe a nuestra oficina a travs de MyChart de [place] o por telfono llamando al [phone] y presione la opcin 4.

## 2021-04-15 NOTE — Progress Notes (Signed)
Follow-Up Visit   Subjective  Amy Wells is a 81 y.o. female who presents for the following: Telogen effluvium (Scalp, 71m f/u, tried the Minoxidil and hands had gotten red and burning so d/c) and Scar (L hip from hip replacement, would like recommendation for scar cream).  The patient has spots, moles and lesions to be evaluated, some may be new or changing and the patient has concerns that these could be cancer.  The following portions of the chart were reviewed this encounter and updated as appropriate:       Review of Systems:  No other skin or systemic complaints except as noted in HPI or Assessment and Plan.  Objective  Well appearing patient in no apparent distress; mood and affect are within normal limits.  A focused examination was performed including scalp, left hip. Relevant physical exam findings are noted in the Assessment and Plan.  Scalp Thinning vertex scalp and Diffuse thinning of the crown and widening of the midline part with retention of the frontal hairline - Reviewed progressive nature and prognosis.   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  Can use OTC Rogaine (minoxidil) 5% solution/foam as directed. Instructions given.   Pt has reaction to oral minoxidil, so stopped  Recommend minoxidil 5% (Rogaine for men) solution or foam to be applied to the scalp and left in. This should ideally be used twice daily for best results but it helps with hair regrowth when used at least three times per week. Rogaine initially can cause increased  hair shedding for the first few weeks but this will stop with continued use. In studies, people who used minoxidil (Rogaine) for at least 6 months had thicker hair than people who did not. Minoxidil topical (Rogaine) only works as long as it continues to be used. If if it is no longer used then the hair it has been helping to regrow can fall out. Minoxidil topical (Rogaine) can cause increased facial hair growth which can usually be managed easily with a battery-operated hair trimmer. If facial hair growth is bothersome, switching to the 2% women's version can decrease the risk of unwanted facial hair growth.   Discussed may have some more shedding due to recent hip replacement surgery 7 weeks ago  Telogen effluvium is a benign, self-limited condition causing increased hair shedding usually for several months. It does not progress to baldness, and the hair eventually grows back on its own. It can be triggered by recent illness, recent surgery, thyroid disease, low iron stores, vitamin D deficiency, fad diets or rapid weight loss, hormonal changes such as pregnancy or birth control pills, and some medication. Usually the hair loss starts 2-3 months after the illness or health change. Rarely, it can continue for longer than a year.      Scar L ant hip  Recommend Serica scar formula bid  Also scar tends to soften over time, may take a year   Purpura - Chronic; persistent and recurrent.  Treatable, but not curable. - Violaceous macules and patches - Benign - Related to trauma, age, sun damage and/or use of blood thinners,  chronic use of topical and/or oral steroids - Observe - Can use OTC arnica containing moisturizer such as Dermend Bruise Formula if desired - Call for worsening or other concerns  Return for as scheduled for TBSE.  I, Othelia Pulling, RMA, am acting as scribe for Brendolyn Patty, MD .  Documentation: I have reviewed the above documentation for accuracy and completeness, and I  agree with the above.  Brendolyn Patty MD

## 2021-05-28 DIAGNOSIS — S61206A Unspecified open wound of right little finger without damage to nail, initial encounter: Secondary | ICD-10-CM | POA: Diagnosis not present

## 2021-05-28 DIAGNOSIS — Z7951 Long term (current) use of inhaled steroids: Secondary | ICD-10-CM | POA: Diagnosis not present

## 2021-05-28 DIAGNOSIS — E039 Hypothyroidism, unspecified: Secondary | ICD-10-CM | POA: Diagnosis not present

## 2021-05-28 DIAGNOSIS — Z79899 Other long term (current) drug therapy: Secondary | ICD-10-CM | POA: Diagnosis not present

## 2021-05-28 DIAGNOSIS — Y92410 Unspecified street and highway as the place of occurrence of the external cause: Secondary | ICD-10-CM | POA: Diagnosis not present

## 2021-05-28 DIAGNOSIS — W010XXA Fall on same level from slipping, tripping and stumbling without subsequent striking against object, initial encounter: Secondary | ICD-10-CM | POA: Diagnosis not present

## 2021-05-28 DIAGNOSIS — S62616A Displaced fracture of proximal phalanx of right little finger, initial encounter for closed fracture: Secondary | ICD-10-CM | POA: Diagnosis not present

## 2021-05-28 DIAGNOSIS — J45909 Unspecified asthma, uncomplicated: Secondary | ICD-10-CM | POA: Diagnosis not present

## 2021-06-05 DIAGNOSIS — Z96642 Presence of left artificial hip joint: Secondary | ICD-10-CM | POA: Diagnosis not present

## 2021-06-05 DIAGNOSIS — J40 Bronchitis, not specified as acute or chronic: Secondary | ICD-10-CM | POA: Diagnosis not present

## 2021-06-05 DIAGNOSIS — J45902 Unspecified asthma with status asthmaticus: Secondary | ICD-10-CM | POA: Diagnosis not present

## 2021-06-05 DIAGNOSIS — S62617A Displaced fracture of proximal phalanx of left little finger, initial encounter for closed fracture: Secondary | ICD-10-CM | POA: Diagnosis not present

## 2021-06-05 DIAGNOSIS — S62606A Fracture of unspecified phalanx of right little finger, initial encounter for closed fracture: Secondary | ICD-10-CM | POA: Diagnosis not present

## 2021-06-05 DIAGNOSIS — M1612 Unilateral primary osteoarthritis, left hip: Secondary | ICD-10-CM | POA: Diagnosis not present

## 2021-06-07 DIAGNOSIS — D500 Iron deficiency anemia secondary to blood loss (chronic): Secondary | ICD-10-CM | POA: Diagnosis not present

## 2021-06-10 DIAGNOSIS — S62617D Displaced fracture of proximal phalanx of left little finger, subsequent encounter for fracture with routine healing: Secondary | ICD-10-CM | POA: Diagnosis not present

## 2021-06-11 DIAGNOSIS — Z961 Presence of intraocular lens: Secondary | ICD-10-CM | POA: Diagnosis not present

## 2021-07-03 DIAGNOSIS — S62617D Displaced fracture of proximal phalanx of left little finger, subsequent encounter for fracture with routine healing: Secondary | ICD-10-CM | POA: Diagnosis not present

## 2022-01-06 DIAGNOSIS — B0052 Herpesviral keratitis: Secondary | ICD-10-CM | POA: Diagnosis not present

## 2022-01-09 DIAGNOSIS — B0052 Herpesviral keratitis: Secondary | ICD-10-CM | POA: Diagnosis not present

## 2022-01-10 DIAGNOSIS — A938 Other specified arthropod-borne viral fevers: Secondary | ICD-10-CM | POA: Diagnosis not present

## 2022-01-10 DIAGNOSIS — J40 Bronchitis, not specified as acute or chronic: Secondary | ICD-10-CM | POA: Diagnosis not present

## 2022-01-13 ENCOUNTER — Ambulatory Visit: Payer: PPO | Admitting: Dermatology

## 2022-01-13 DIAGNOSIS — L821 Other seborrheic keratosis: Secondary | ICD-10-CM | POA: Diagnosis not present

## 2022-01-13 DIAGNOSIS — D225 Melanocytic nevi of trunk: Secondary | ICD-10-CM | POA: Diagnosis not present

## 2022-01-13 DIAGNOSIS — L578 Other skin changes due to chronic exposure to nonionizing radiation: Secondary | ICD-10-CM

## 2022-01-13 DIAGNOSIS — D2239 Melanocytic nevi of other parts of face: Secondary | ICD-10-CM

## 2022-01-13 DIAGNOSIS — D229 Melanocytic nevi, unspecified: Secondary | ICD-10-CM

## 2022-01-13 DIAGNOSIS — L649 Androgenic alopecia, unspecified: Secondary | ICD-10-CM | POA: Diagnosis not present

## 2022-01-13 DIAGNOSIS — L219 Seborrheic dermatitis, unspecified: Secondary | ICD-10-CM

## 2022-01-13 DIAGNOSIS — D692 Other nonthrombocytopenic purpura: Secondary | ICD-10-CM

## 2022-01-13 DIAGNOSIS — Z1283 Encounter for screening for malignant neoplasm of skin: Secondary | ICD-10-CM | POA: Diagnosis not present

## 2022-01-13 DIAGNOSIS — L814 Other melanin hyperpigmentation: Secondary | ICD-10-CM | POA: Diagnosis not present

## 2022-01-13 DIAGNOSIS — L650 Telogen effluvium: Secondary | ICD-10-CM | POA: Diagnosis not present

## 2022-01-13 DIAGNOSIS — Z7189 Other specified counseling: Secondary | ICD-10-CM | POA: Diagnosis not present

## 2022-01-13 MED ORDER — FLUOCINOLONE ACETONIDE 0.01 % OT OIL: 1.0000 | TOPICAL_OIL | OTIC | 6 refills | Status: DC

## 2022-01-13 NOTE — Progress Notes (Signed)
Follow-Up Visit   Subjective  Amy Wells is a 81 y.o. female who presents for the following: Total body skin exam (Check spot, R post thigh, no symptoms), Seborrheic Dermatitis (Ears, Dermotic oil), and Telogen effluvium (Scalp, not able to tolerate oral minoxidil, not using Rogain). Hair has grown back some on its own.  The patient presents for Total-Body Skin Exam (TBSE) for skin cancer screening and mole check.  The patient has spots, moles and lesions to be evaluated, some may be new or changing and the patient has concerns that these could be cancer.   The following portions of the chart were reviewed this encounter and updated as appropriate:       Review of Systems:  No other skin or systemic complaints except as noted in HPI or Assessment and Plan.  Objective  Well appearing patient in no apparent distress; mood and affect are within normal limits.  A full examination was performed including scalp, head, eyes, ears, nose, lips, neck, chest, axillae, abdomen, back, buttocks, bilateral upper extremities, bilateral lower extremities, hands, feet, fingers, toes, fingernails, and toenails. All findings within normal limits unless otherwise noted below.  Right Thigh - Posterior Stuck-on, waxy, tan-brown papule or plaque --Discussed benign etiology and prognosis.   bil ears Ears clear today  R spinal lower back  R spinal lower back 2.75m med dark brown macule  Scalp Hair thinning    Assessment & Plan   Lentigines - Scattered tan macules - Due to sun exposure - Benign-appearing, observe - Recommend daily broad spectrum sunscreen SPF 30+ to sun-exposed areas, reapply every 2 hours as needed. - Call for any changes - back, face  Seborrheic Keratoses - Stuck-on, waxy, tan-brown papules and/or plaques  - Benign-appearing - Discussed benign etiology and prognosis. - Observe - Call for any changes - hairline scalp, face  Melanocytic Nevi - Tan-brown and/or  pink-flesh-colored symmetric macules and papules - Benign appearing on exam today - Observation - Call clinic for new or changing moles - Recommend daily use of broad spectrum spf 30+ sunscreen to sun-exposed areas.  - back, face  Hemangiomas - Red papules - Discussed benign nature - Observe - Call for any changes - chest, back, abdomen  Actinic Damage - Chronic condition, secondary to cumulative UV/sun exposure - diffuse scaly erythematous macules with underlying dyspigmentation - Recommend daily broad spectrum sunscreen SPF 30+ to sun-exposed areas, reapply every 2 hours as needed.  - Staying in the shade or wearing long sleeves, sun glasses (UVA+UVB protection) and wide brim hats (4-inch brim around the entire circumference of the hat) are also recommended for sun protection.  - Call for new or changing lesions.  Skin cancer screening performed today.   Purpura - Chronic; persistent and recurrent.  Treatable, but not curable. - Violaceous macules and patches - Benign - Related to trauma, age, sun damage and/or use of blood thinners, chronic use of topical and/or oral steroids - Observe - Can use OTC arnica containing moisturizer such as Dermend Bruise Formula if desired - Call for worsening or other concerns   Seborrheic keratosis Right Thigh - Posterior  Reassured benign age-related growth.  Recommend observation.  Discussed cryotherapy if spot(s) become irritated or inflamed.   Seborrheic dermatitis bil ears  Chronic condition with duration or expected duration over one year. Currently well-controlled.   Seborrheic Dermatitis  -  is a chronic persistent rash characterized by pinkness and scaling most commonly of the mid face but also can occur on the  scalp (dandruff), ears; mid chest, mid back and groin.  It tends to be exacerbated by stress and cooler weather.  People who have neurologic disease may experience new onset or exacerbation of existing seborrheic  dermatitis.  The condition is not curable but treatable and can be controlled.  Cont Dermotic oil prn flares  Fluocinolone Acetonide (DERMOTIC) 0.01 % OIL - bil ears Place 1 Application in ear(s) as directed. Qd up to 5 days a week aa ears prn flares  Nevus R spinal lower back  Benign-appearing.  Observation.  Call clinic for new or changing moles.  Recommend daily use of broad spectrum spf 30+ sunscreen to sun-exposed areas.    Telogen effluvium Scalp  With Androgenetic Alopecia, Chronic and persistent condition with duration or expected duration over one year.  Not currently at goal but not able to tolerate oral minoxidil.  Improving, but could worsen again due to recent history of 2nd Covid infection.  Telogen Effluvium Counseling Telogen effluvium is a benign, self-limited condition causing increased hair shedding usually for several months. It does not progress to baldness, and the hair eventually grows back on its own. It can be triggered by recent illness, recent surgery, thyroid disease, low iron stores, vitamin D deficiency, fad diets or rapid weight loss, hormonal changes such as pregnancy or birth control pills, and some medication. Usually the hair loss starts 2-3 months after the illness or health change. Rarely, it can continue for longer than a year. Treatments options may include oral or topical Minoxidil; Red Light scalp treatments; Biotin 2.5 mg daily and other options.  Hx of Covid again ~50mago  Pt could not tolerate oral Minoxidil, recommend topical Rogaine if hair loss worsens   Return in about 1 year (around 01/14/2023) for TBSE.  I, SOthelia Pulling RMA, am acting as scribe for TBrendolyn Patty MD .  Documentation: I have reviewed the above documentation for accuracy and completeness, and I agree with the above.  TBrendolyn PattyMD

## 2022-01-13 NOTE — Patient Instructions (Addendum)
Recommend minoxidil 5% (Rogaine for men) solution or foam to be applied to the scalp and left in. This should ideally be used twice daily for best results but it helps with hair regrowth when used at least three times per week. Rogaine initially can cause increased hair shedding for the first few weeks but this will stop with continued use. In studies, people who used minoxidil (Rogaine) for at least 6 months had thicker hair than people who did not. Minoxidil topical (Rogaine) only works as long as it continues to be used. If if it is no longer used then the hair it has been helping to regrow can fall out. Minoxidil topical (Rogaine) can cause increased facial hair growth which can usually be managed easily with a battery-operated hair trimmer. If facial hair growth is bothersome, switching to the 2% women's version can decrease the risk of unwanted facial hair growth.     Due to recent changes in healthcare laws, you may see results of your pathology and/or laboratory studies on MyChart before the doctors have had a chance to review them. We understand that in some cases there may be results that are confusing or concerning to you. Please understand that not all results are received at the same time and often the doctors may need to interpret multiple results in order to provide you with the best plan of care or course of treatment. Therefore, we ask that you please give [place] 2 business days to thoroughly review all your results before contacting the office for clarification. Should we see a critical lab result, you will be contacted sooner.   If You Need Anything After Your Visit  If you have any questions or concerns for your doctor, please call our main line at [phone] and press option 4 to reach your doctor's medical assistant. If no one answers, please leave a voicemail as directed and we will return your call as soon as possible. Messages left after 4 pm will be answered the following business  day.   You may also send [place] a message via [place]. We typically respond to MyChart messages within 1-2 business days.  For prescription refills, please ask your pharmacy to contact our office. Our fax number is [phone].  If you have an urgent issue when the clinic is closed that cannot wait until the next business day, you can page your doctor at the number below.    Please note that while we do our best to be available for urgent issues outside of office hours, we are not available 24/7.   If you have an urgent issue and are unable to reach [place], you may choose to seek medical care at your doctor's office, retail clinic, urgent care center, or emergency room.  If you have a medical emergency, please immediately call 911 or go to the emergency department.  Pager Numbers  - Dr. Nehemiah Massed: [phone]  - Dr. Laurence Ferrari: [phone]  - Dr. Nicole Kindred: [phone]  In the event of inclement weather, please call our main line at [phone] for an update on the status of any delays or closures.  Dermatology Medication Tips: Please keep the boxes that topical medications come in in order to help keep track of the instructions about where and how to use these. Pharmacies typically print the medication instructions only on the boxes and not directly on the medication tubes.   If your medication is too expensive, please contact our office at [phone] option 4 or send [place] a message through  MyChart.   We are unable to tell what your co-pay for medications will be in advance as this is different depending on your insurance coverage. However, we may be able to find a substitute medication at lower cost or fill out paperwork to get insurance to cover a needed medication.   If a prior authorization is required to get your medication covered by your insurance company, please allow [place] 1-2 business days to complete this process.  Drug prices often vary depending on where the prescription is filled and  some pharmacies may offer cheaper prices.  The website www.goodrx.com contains coupons for medications through different pharmacies. The prices here do not account for what the cost may be with help from insurance (it may be cheaper with your insurance), but the website can give you the price if you did not use any insurance.  - You can print the associated coupon and take it with your prescription to the pharmacy.  - You may also stop by our office during regular business hours and pick up a GoodRx coupon card.  - If you need your prescription sent electronically to a different pharmacy, notify our office through [facility] or by phone at [phone] option 4.     Si Usted Necesita Algo Despus de Su Visita  Tambin puede enviarnos un mensaje a travs de Pharmacist, community. Por lo general respondemos a los mensajes de MyChart en el transcurso de 1 a 2 das hbiles.  Para renovar recetas, por favor pida a su farmacia que se ponga en contacto con nuestra oficina. Harland Dingwall de fax es [place] [phone].  Si tiene un asunto urgente cuando la clnica est cerrada y que no puede esperar hasta el siguiente da hbil, puede llamar/localizar a su doctor(a) al nmero que aparece a continuacin.   Por favor, tenga en cuenta que aunque hacemos todo lo posible para estar disponibles para asuntos urgentes fuera del horario de [place], no estamos disponibles las 24 horas del da, los 7 das de la [place].   Si tiene un problema urgente y no puede comunicarse con nosotros, puede optar por buscar atencin mdica  en el consultorio de su doctor(a), en una clnica privada, en un centro de atencin urgente o en una sala de emergencias.  Si tiene Engineering geologist, por favor llame inmediatamente al 911 o vaya a la sala de emergencias.  Nmeros de bper  - Dr. Nehemiah Massed: [phone]  - Dra. Moye: [phone]  - Dra. Nicole Kindred: [phone]  En caso de inclemencias del [place], por favor llame a Johnsie Kindred principal al [phone] para una actualizacin sobre el [place] de cualquier retraso o cierre.  Consejos para la medicacin en dermatologa: Por favor, guarde las cajas en las que vienen los medicamentos de uso tpico para ayudarle a seguir las instrucciones sobre dnde y cmo usarlos. Las farmacias generalmente imprimen las instrucciones del medicamento slo en las cajas y no directamente en los tubos del [place].   Si su medicamento es muy caro, por favor, pngase en contacto con Zigmund Daniel llamando al [phone] y presione la opcin 4 o envenos un mensaje a travs de Pharmacist, community.   No podemos decirle cul ser su copago por los medicamentos por adelantado ya que esto es diferente dependiendo de la cobertura de su seguro. Sin embargo, es posible que podamos encontrar un medicamento sustituto a Electrical engineer un formulario para que el seguro cubra el medicamento que se considera necesario.   Si se requiere una autorizacin previa  para que su compaa de seguros [place] su medicamento, por favor permtanos de 1 a 2 das hbiles para completar este proceso.  Los precios de los medicamentos varan con frecuencia dependiendo del Environmental consultant de dnde se surte la receta y alguna farmacias pueden ofrecer precios ms baratos.  El sitio web www.goodrx.com tiene cupones para medicamentos de Airline pilot. Los precios aqu no tienen en cuenta lo que podra costar con la ayuda del seguro (puede ser ms barato con su seguro), pero el sitio web puede darle el precio si no utiliz Research scientist (physical sciences).  - Puede imprimir el cupn correspondiente y llevarlo con su receta a la farmacia.  - Tambin puede pasar por nuestra oficina durante el horario de atencin regular y Charity fundraiser una tarjeta de cupones de GoodRx.  - Si necesita que su receta se enve electrnicamente a una farmacia diferente, informe a nuestra oficina a travs de MyChart de [place] o por telfono llamando al [phone] y presione la  opcin 4.

## 2022-01-15 DIAGNOSIS — B0052 Herpesviral keratitis: Secondary | ICD-10-CM | POA: Diagnosis not present

## 2022-01-24 DIAGNOSIS — E538 Deficiency of other specified B group vitamins: Secondary | ICD-10-CM | POA: Diagnosis not present

## 2022-01-24 DIAGNOSIS — E782 Mixed hyperlipidemia: Secondary | ICD-10-CM | POA: Diagnosis not present

## 2022-01-28 DIAGNOSIS — Z0000 Encounter for general adult medical examination without abnormal findings: Secondary | ICD-10-CM | POA: Diagnosis not present

## 2022-01-28 DIAGNOSIS — R739 Hyperglycemia, unspecified: Secondary | ICD-10-CM | POA: Diagnosis not present

## 2022-01-28 DIAGNOSIS — E782 Mixed hyperlipidemia: Secondary | ICD-10-CM | POA: Diagnosis not present

## 2022-01-28 DIAGNOSIS — E039 Hypothyroidism, unspecified: Secondary | ICD-10-CM | POA: Diagnosis not present

## 2022-02-04 ENCOUNTER — Other Ambulatory Visit: Payer: Self-pay | Admitting: Internal Medicine

## 2022-02-04 DIAGNOSIS — Z1231 Encounter for screening mammogram for malignant neoplasm of breast: Secondary | ICD-10-CM

## 2022-02-26 ENCOUNTER — Ambulatory Visit: Payer: PPO | Admitting: Dermatology

## 2022-02-26 DIAGNOSIS — J188 Other pneumonia, unspecified organism: Secondary | ICD-10-CM | POA: Diagnosis not present

## 2022-02-26 DIAGNOSIS — L249 Irritant contact dermatitis, unspecified cause: Secondary | ICD-10-CM | POA: Diagnosis not present

## 2022-02-26 DIAGNOSIS — R079 Chest pain, unspecified: Secondary | ICD-10-CM | POA: Diagnosis not present

## 2022-02-26 DIAGNOSIS — J189 Pneumonia, unspecified organism: Secondary | ICD-10-CM | POA: Diagnosis not present

## 2022-02-26 DIAGNOSIS — L821 Other seborrheic keratosis: Secondary | ICD-10-CM | POA: Diagnosis not present

## 2022-02-26 DIAGNOSIS — J45902 Unspecified asthma with status asthmaticus: Secondary | ICD-10-CM | POA: Diagnosis not present

## 2022-02-26 MED ORDER — CLOBETASOL PROPIONATE 0.05 % EX OINT: TOPICAL_OINTMENT | CUTANEOUS | 0 refills | Status: DC

## 2022-02-26 NOTE — Patient Instructions (Addendum)
Start Clobetasol Ointment - apply to vaginal area twice a day for about 1-2 weeks until itch improved, then decrease to once a day, then taper off as itching improves. Avoid skin folds, face, underarms.   Possible Irritant Dermatitis vs Lichen Sclerosis Lichen sclerosus is a chronic inflammatory condition of unknown cause that frequently involves the vaginal area and less commonly extragenital skin, and is NOT sexually transmitted. It frequently causes symptoms of pain and burning.  It requires regular monitoring and treatment with topical steroids to minimize inflammation and to reduce risk of scarring. There is also a risk of cancer in the vaginal area which is very low if inflammation is well controlled. Regular checks of the area are recommended. Please call if you notice any new or changing spots within this area.   Seborrheic Keratosis  What causes seborrheic keratoses? Seborrheic keratoses are harmless, common skin growths that first appear during adult life.  As time goes by, more growths appear.  Some people may develop a large number of them.  Seborrheic keratoses appear on both covered and uncovered body parts.  They are not caused by sunlight.  The tendency to develop seborrheic keratoses can be inherited.  They vary in color from skin-colored to gray, brown, or even black.  They can be either smooth or have a rough, warty surface.   Seborrheic keratoses are superficial and look as if they were stuck on the skin.  Under the microscope this type of keratosis looks like layers upon layers of skin.  That is why at times the top layer may seem to fall off, but the rest of the growth remains and re-grows.    Treatment Seborrheic keratoses do not need to be treated, but can easily be removed in the office.  Seborrheic keratoses often cause symptoms when they rub on clothing or jewelry.  Lesions can be in the way of shaving.  If they become inflamed, they can cause itching, soreness, or burning.   Removal of a seborrheic keratosis can be accomplished by freezing, burning, or surgery. If any spot bleeds, scabs, or grows rapidly, please return to have it checked, as these can be an indication of a skin cancer.  Due to recent changes in healthcare laws, you may see results of your pathology and/or laboratory studies on MyChart before the doctors have had a chance to review them. We understand that in some cases there may be results that are confusing or concerning to you. Please understand that not all results are received at the same time and often the doctors may need to interpret multiple results in order to provide you with the best plan of care or course of treatment. Therefore, we ask that you please give [place] 2 business days to thoroughly review all your results before contacting the office for clarification. Should we see a critical lab result, you will be contacted sooner.   If You Need Anything After Your Visit  If you have any questions or concerns for your doctor, please call our main line at [phone] and press option 4 to reach your doctor's medical assistant. If no one answers, please leave a voicemail as directed and we will return your call as soon as possible. Messages left after 4 pm will be answered the following business day.   You may also send [place] a message via [place]. We typically respond to MyChart messages within 1-2 business days.  For prescription refills, please ask your pharmacy to contact our office. Our fax number  is [phone].  If you have an urgent issue when the clinic is closed that cannot wait until the next business day, you can page your doctor at the number below.    Please note that while we do our best to be available for urgent issues outside of office hours, we are not available 24/7.   If you have an urgent issue and are unable to reach [place], you may choose to seek medical care at your doctor's office, retail clinic, urgent care center, or  emergency room.  If you have a medical emergency, please immediately call 911 or go to the emergency department.  Pager Numbers  - Dr. Nehemiah Massed: [phone]  - Dr. Laurence Ferrari: [phone]  - Dr. Nicole Kindred: [phone]  In the event of inclement weather, please call our main line at [phone] for an update on the status of any delays or closures.  Dermatology Medication Tips: Please keep the boxes that topical medications come in in order to help keep track of the instructions about where and how to use these. Pharmacies typically print the medication instructions only on the boxes and not directly on the medication tubes.   If your medication is too expensive, please contact our office at [phone] option 4 or send [place] a message through [place].   We are unable to tell what your co-pay for medications will be in advance as this is different depending on your insurance coverage. However, we may be able to find a substitute medication at lower cost or fill out paperwork to get insurance to cover a needed medication.   If a prior authorization is required to get your medication covered by your insurance company, please allow [place] 1-2 business days to complete this process.  Drug prices often vary depending on where the prescription is filled and some pharmacies may offer cheaper prices.  The website www.goodrx.com contains coupons for medications through different pharmacies. The prices here do not account for what the cost may be with help from insurance (it may be cheaper with your insurance), but the website can give you the price if you did not use any insurance.  - You can print the associated coupon and take it with your prescription to the pharmacy.  - You may also stop by our office during regular business hours and pick up a GoodRx coupon card.  - If you need your prescription sent electronically to a different pharmacy, notify our office through [facility] or by phone at  [phone] option 4.     Si Usted Necesita Algo Despus de Su Visita  Tambin puede enviarnos un mensaje a travs de Pharmacist, community. Por lo general respondemos a los mensajes de MyChart en el transcurso de 1 a 2 das hbiles.  Para renovar recetas, por favor pida a su farmacia que se ponga en contacto con nuestra oficina. Harland Dingwall de fax es [place] [phone].  Si tiene un asunto urgente cuando la clnica est cerrada y que no puede esperar hasta el siguiente da hbil, puede llamar/localizar a su doctor(a) al nmero que aparece a continuacin.   Por favor, tenga en cuenta que aunque hacemos todo lo posible para estar disponibles para asuntos urgentes fuera del horario de [place], no estamos disponibles las 24 horas del da, los 7 das de la [place].   Si tiene un problema urgente y no puede comunicarse con nosotros, puede optar por buscar atencin mdica  en el consultorio de su doctor(a), en una clnica privada, en un centro de atencin  urgente o en una sala de emergencias.  Si tiene Engineering geologist, por favor llame inmediatamente al 911 o vaya a la sala de emergencias.  Nmeros de bper  - Dr. Nehemiah Massed: [phone]  - Dra. Moye: [phone]  - Dra. Nicole Kindred: [phone]  En caso de inclemencias del [place], por favor llame a Johnsie Kindred principal al [phone] para una actualizacin sobre el [place] de cualquier retraso o cierre.  Consejos para la medicacin en dermatologa: Por favor, guarde las cajas en las que vienen los medicamentos de uso tpico para ayudarle a seguir las instrucciones sobre dnde y cmo usarlos. Las farmacias generalmente imprimen las instrucciones del medicamento slo en las cajas y no directamente en los tubos del [place].   Si su medicamento es muy caro, por favor, pngase en contacto con Zigmund Daniel llamando al [phone] y presione la opcin 4 o envenos un mensaje a travs de Pharmacist, community.   No podemos decirle cul ser su copago por los  medicamentos por adelantado ya que esto es diferente dependiendo de la cobertura de su seguro. Sin embargo, es posible que podamos encontrar un medicamento sustituto a Electrical engineer un formulario para que el seguro cubra el medicamento que se considera necesario.   Si se requiere una autorizacin previa para que su compaa de seguros [place] su medicamento, por favor permtanos de 1 a 2 das hbiles para completar este proceso.  Los precios de los medicamentos varan con frecuencia dependiendo del Environmental consultant de dnde se surte la receta y alguna farmacias pueden ofrecer precios ms baratos.  El sitio web www.goodrx.com tiene cupones para medicamentos de Airline pilot. Los precios aqu no tienen en cuenta lo que podra costar con la ayuda del seguro (puede ser ms barato con su seguro), pero el sitio web puede darle el precio si no utiliz Research scientist (physical sciences).  - Puede imprimir el cupn correspondiente y llevarlo con su receta a la farmacia.  - Tambin puede pasar por nuestra oficina durante el horario de atencin regular y Charity fundraiser una tarjeta de cupones de GoodRx.  - Si necesita que su receta se enve electrnicamente a una farmacia diferente, informe a nuestra oficina a travs de MyChart de [place] o por telfono llamando al [phone] y presione la opcin 4.

## 2022-02-26 NOTE — Progress Notes (Signed)
   Follow-Up Visit   Subjective  Amy Wells is a 82 y.o. female who presents for the following: growth (L axilla, husband noticed 3 weeks ago. ) and itch (Vaginal area, 2-3 weeks. Using cortisone cream. ). She has a history of eczema of the eyelids and is sensitive to many products. No new products.  The following portions of the chart were reviewed this encounter and updated as appropriate:       Review of Systems:  No other skin or systemic complaints except as noted in HPI or Assessment and Plan.  Objective  Well appearing patient in no apparent distress; mood and affect are within normal limits.  A focused examination was performed including face, axilla, vaginal area. Relevant physical exam findings are noted in the Assessment and Plan.  vaginal area Well demarcated erythema of the labia majora and minora.  Labia minora with agglutination.     Assessment & Plan  Seborrheic Keratoses - Stuck-on, waxy, tan-brown papule, left axilla  - Benign-appearing - Discussed benign etiology and prognosis. - Observe - Call for any changes  Irritant dermatitis vaginal area  vs LS et A  Lichen sclerosus is a chronic inflammatory condition of unknown cause that frequently involves the vaginal area and less commonly extragenital skin, and is NOT sexually transmitted. It frequently causes symptoms of pain and burning.  It requires regular monitoring and treatment with topical steroids to minimize inflammation and to reduce risk of scarring. There is also a risk of cancer in the vaginal area which is very low if inflammation is well controlled. Regular checks of the area are recommended. Please call if you notice any new or changing spots within this area.  Avoid applying any other topical to vaginal area Start clobetasol ointment bid to labia x 2 wks or until symptoms resolved, then qd, then qod, then taper off to least amount of usage needed to control symptoms. Avoid applying to  skin folds  clobetasol ointment (TEMOVATE) 0.05 % - vaginal area Apply to affected area once to twice daily as directed. Avoid skin folds.   Return as scheduled, for TBSE, f/up possible LS&A.  IJamesetta Orleans, CMA, am acting as scribe for Brendolyn Patty, MD .  Documentation: I have reviewed the above documentation for accuracy and completeness, and I agree with the above.  Brendolyn Patty MD

## 2022-02-28 ENCOUNTER — Ambulatory Visit
Admission: RE | Admit: 2022-02-28 | Discharge: 2022-02-28 | Disposition: A | Discharge status: Home / Self Care | Payer: PPO | Source: Ambulatory Visit | Attending: Internal Medicine | Admitting: Internal Medicine

## 2022-02-28 DIAGNOSIS — Z1231 Encounter for screening mammogram for malignant neoplasm of breast: Secondary | ICD-10-CM

## 2022-04-22 DIAGNOSIS — S99911A Unspecified injury of right ankle, initial encounter: Secondary | ICD-10-CM | POA: Diagnosis not present

## 2022-04-22 DIAGNOSIS — S93401A Sprain of unspecified ligament of right ankle, initial encounter: Secondary | ICD-10-CM | POA: Diagnosis not present

## 2022-05-19 DIAGNOSIS — M7522 Bicipital tendinitis, left shoulder: Secondary | ICD-10-CM | POA: Diagnosis not present

## 2022-05-19 DIAGNOSIS — M47812 Spondylosis without myelopathy or radiculopathy, cervical region: Secondary | ICD-10-CM | POA: Diagnosis not present

## 2022-05-19 DIAGNOSIS — M25512 Pain in left shoulder: Secondary | ICD-10-CM | POA: Diagnosis not present

## 2022-05-19 DIAGNOSIS — M542 Cervicalgia: Secondary | ICD-10-CM | POA: Diagnosis not present

## 2022-05-26 DIAGNOSIS — M1612 Unilateral primary osteoarthritis, left hip: Secondary | ICD-10-CM | POA: Diagnosis not present

## 2022-05-26 DIAGNOSIS — Z96642 Presence of left artificial hip joint: Secondary | ICD-10-CM | POA: Diagnosis not present

## 2022-05-28 DIAGNOSIS — M7522 Bicipital tendinitis, left shoulder: Secondary | ICD-10-CM | POA: Diagnosis not present

## 2022-05-29 DIAGNOSIS — B0052 Herpesviral keratitis: Secondary | ICD-10-CM | POA: Diagnosis not present

## 2022-05-29 DIAGNOSIS — Z961 Presence of intraocular lens: Secondary | ICD-10-CM | POA: Diagnosis not present

## 2022-05-29 DIAGNOSIS — H2512 Age-related nuclear cataract, left eye: Secondary | ICD-10-CM | POA: Diagnosis not present

## 2022-06-04 DIAGNOSIS — M7522 Bicipital tendinitis, left shoulder: Secondary | ICD-10-CM | POA: Diagnosis not present

## 2022-06-12 DIAGNOSIS — M7522 Bicipital tendinitis, left shoulder: Secondary | ICD-10-CM | POA: Diagnosis not present

## 2022-06-16 DIAGNOSIS — M7522 Bicipital tendinitis, left shoulder: Secondary | ICD-10-CM | POA: Diagnosis not present

## 2022-06-27 DIAGNOSIS — M7522 Bicipital tendinitis, left shoulder: Secondary | ICD-10-CM | POA: Diagnosis not present

## 2022-07-08 DIAGNOSIS — M542 Cervicalgia: Secondary | ICD-10-CM | POA: Diagnosis not present

## 2022-07-08 DIAGNOSIS — R531 Weakness: Secondary | ICD-10-CM | POA: Diagnosis not present

## 2022-07-08 DIAGNOSIS — M7522 Bicipital tendinitis, left shoulder: Secondary | ICD-10-CM | POA: Diagnosis not present

## 2022-07-14 DIAGNOSIS — M47816 Spondylosis without myelopathy or radiculopathy, lumbar region: Secondary | ICD-10-CM | POA: Diagnosis not present

## 2022-07-14 DIAGNOSIS — M5489 Other dorsalgia: Secondary | ICD-10-CM | POA: Diagnosis not present

## 2022-07-16 DIAGNOSIS — R531 Weakness: Secondary | ICD-10-CM | POA: Diagnosis not present

## 2022-07-16 DIAGNOSIS — M542 Cervicalgia: Secondary | ICD-10-CM | POA: Diagnosis not present

## 2022-07-16 DIAGNOSIS — M7522 Bicipital tendinitis, left shoulder: Secondary | ICD-10-CM | POA: Diagnosis not present

## 2022-07-22 ENCOUNTER — Telehealth: Payer: Self-pay

## 2022-07-22 NOTE — Telephone Encounter (Signed)
Patient calling to let Dr Roseanne Reno know she, Amy Wells ointment is not working for itch in the vaginal area

## 2022-07-23 DIAGNOSIS — R531 Weakness: Secondary | ICD-10-CM | POA: Diagnosis not present

## 2022-08-13 ENCOUNTER — Ambulatory Visit: Payer: PPO | Admitting: Dermatology

## 2022-08-27 ENCOUNTER — Ambulatory Visit: Payer: PPO | Admitting: Dermatology

## 2022-08-27 VITALS — BP 107/60 | HR 65

## 2022-08-27 DIAGNOSIS — L900 Lichen sclerosus et atrophicus: Secondary | ICD-10-CM | POA: Diagnosis not present

## 2022-08-27 DIAGNOSIS — L84 Corns and callosities: Secondary | ICD-10-CM | POA: Diagnosis not present

## 2022-08-27 DIAGNOSIS — N904 Leukoplakia of vulva: Secondary | ICD-10-CM

## 2022-08-27 DIAGNOSIS — L249 Irritant contact dermatitis, unspecified cause: Secondary | ICD-10-CM

## 2022-08-27 MED ORDER — CLOBETASOL PROPIONATE 0.05 % EX OINT: TOPICAL_OINTMENT | CUTANEOUS | 1 refills | Status: AC

## 2022-08-27 MED ORDER — TACROLIMUS 0.1 % EX OINT: TOPICAL_OINTMENT | CUTANEOUS | 1 refills | Status: AC

## 2022-08-27 NOTE — Patient Instructions (Addendum)
Can alternate between clobetasol cream   Can try tacrolimus ointment apply to affected areas once or twice daily as directed  Can also try strata mg cream - can apply to affected area once or twice daily as needed  Continue clobetasol ointment to affected areas once or twice daily as directed. Avoid applying to face, groin, and axilla. Use as directed. Long-term use can cause thinning of the skin.  Topical steroids (such as triamcinolone, fluocinolone, fluocinonide, mometasone, clobetasol, halobetasol, betamethasone, hydrocortisone) can cause thinning and lightening of the skin if they are used for too long in the same area. Your physician has selected the right strength medicine for your problem and area affected on the body. Please use your medication only as directed by your physician to prevent side effects.    Recommend podiatry for blister concerns at toes    Due to recent changes in healthcare laws, you may see results of your pathology and/or laboratory studies on MyChart before the doctors have had a chance to review them. We understand that in some cases there may be results that are confusing or concerning to you. Please understand that not all results are received at the same time and often the doctors may need to interpret multiple results in order to provide you with the best plan of care or course of treatment. Therefore, we ask that you please give [place] 2 business days to thoroughly review all your results before contacting the office for clarification. Should we see a critical lab result, you will be contacted sooner.   If You Need Anything After Your Visit  If you have any questions or concerns for your doctor, please call our main line at [phone] and press option 4 to reach your doctor's medical assistant. If no one answers, please leave a voicemail as directed and we will return your call as soon as possible. Messages left after 4 pm will be answered the following business  day.   You may also send [place] a message via MyChart. We typically respond to MyChart messages within 1-2 business days.  For prescription refills, please ask your pharmacy to contact our office. Our fax number is [phone].  If you have an urgent issue when the clinic is closed that cannot wait until the next business day, you can page your doctor at the number below.    Please note that while we do our best to be available for urgent issues outside of office hours, we are not available 24/7.   If you have an urgent issue and are unable to reach [place], you may choose to seek medical care at your doctor's office, retail clinic, urgent care center, or emergency room.  If you have a medical emergency, please immediately call 911 or go to the emergency department.  Pager Numbers  - Dr. Gwen Pounds: [phone]  - Dr. Neale Burly: [phone]  - Dr. Roseanne Reno: [phone]  In the event of inclement weather, please call our main line at [phone] for an update on the status of any delays or closures.  Dermatology Medication Tips: Please keep the boxes that topical medications come in in order to help keep track of the instructions about where and how to use these. Pharmacies typically print the medication instructions only on the boxes and not directly on the medication tubes.   If your medication is too expensive, please contact our office at [phone] option 4 or send [place] a message through MyChart.   We are unable to tell what your co-pay  for medications will be in advance as this is different depending on your insurance coverage. However, we may be able to find a substitute medication at lower cost or fill out paperwork to get insurance to cover a needed medication.   If a prior authorization is required to get your medication covered by your insurance company, please allow [place] 1-2 business days to complete this process.  Drug prices often vary depending on where the prescription is filled and  some pharmacies may offer cheaper prices.  The website www.goodrx.com contains coupons for medications through different pharmacies. The prices here do not account for what the cost may be with help from insurance (it may be cheaper with your insurance), but the website can give you the price if you did not use any insurance.  - You can print the associated coupon and take it with your prescription to the pharmacy.  - You may also stop by our office during regular business hours and pick up a GoodRx coupon card.  - If you need your prescription sent electronically to a different pharmacy, notify our office through [facility] or by phone at [phone] option 4.     Si Usted Necesita Algo Despus de Su Visita  Tambin puede enviarnos un mensaje a travs de Clinical cytogeneticist. Por lo general respondemos a los mensajes de MyChart en el transcurso de 1 a 2 das hbiles.  Para renovar recetas, por favor pida a su farmacia que se ponga en contacto con nuestra oficina. Annie Sable de fax es [place] [phone].  Si tiene un asunto urgente cuando la clnica est cerrada y que no puede esperar hasta el siguiente da hbil, puede llamar/localizar a su doctor(a) al nmero que aparece a continuacin.   Por favor, tenga en cuenta que aunque hacemos todo lo posible para estar disponibles para asuntos urgentes fuera del horario de [place], no estamos disponibles las 24 horas del da, los 7 [address]  [address] de la [place].   Si tiene un problema urgente y no puede comunicarse con nosotros, puede optar por buscar atencin mdica  en el consultorio de su doctor(a), en una clnica privada, en un centro de atencin urgente o en una sala de emergencias.  Si tiene Engineer, drilling, por favor llame inmediatamente al 911 o vaya a la sala de emergencias.  Nmeros de bper  - Dr. Gwen Pounds: [phone]  - Dra. Moye: [phone]  - Dra. Roseanne Reno: [phone]  En caso de inclemencias del [place], por favor llame a Lacy Duverney principal al [phone] para una actualizacin sobre el [place] de cualquier retraso o cierre.  Consejos para la medicacin en dermatologa: Por favor, guarde las cajas en las que vienen los medicamentos de uso tpico para ayudarle a seguir las instrucciones sobre dnde y cmo usarlos. Las farmacias generalmente imprimen las instrucciones del medicamento slo en las cajas y no directamente en los tubos del [place].   Si su medicamento es muy caro, por favor, pngase en contacto con Rolm Gala llamando al [phone] y presione la opcin 4 o envenos un mensaje a travs de Clinical cytogeneticist.   No podemos decirle cul ser su copago por los medicamentos por adelantado ya que esto es diferente dependiendo de la cobertura de su seguro. Sin embargo, es posible que podamos encontrar un medicamento sustituto a Audiological scientist un formulario para que el seguro cubra el medicamento que se considera necesario.   Si se requiere una autorizacin previa para que su compaa de seguros [place] su medicamento, por favor  permtanos de 1 a 2 das hbiles para completar [address]  Los precios de los medicamentos varan con frecuencia dependiendo del Environmental consultant de dnde se surte la receta y alguna farmacias pueden ofrecer precios ms baratos.  El sitio web www.goodrx.com tiene cupones para medicamentos de Health and safety inspector. Los precios aqu no tienen en cuenta lo que podra costar con la ayuda del seguro (puede ser ms barato con su seguro), pero el sitio web puede darle el precio si no utiliz Tourist information centre manager.  - Puede imprimir el cupn correspondiente y llevarlo con su receta a la farmacia.  - Tambin puede pasar por nuestra oficina durante el horario de atencin regular y Education officer, museum una tarjeta de cupones de GoodRx.  - Si necesita que su receta se enve electrnicamente a una farmacia diferente, informe a nuestra oficina a travs de MyChart de [place] o por telfono llamando al [phone] y presione la  opcin 4.

## 2022-08-27 NOTE — Progress Notes (Signed)
   Follow-Up Visit   Subjective  Amy Wells is a 83 y.o. female who presents for the following: Patient here concerning LS et A /irritant dermatitis and also reports a blister at 2nd digit on right foot.  Uses Clobetasol ointment once nightly and keeps symptoms under control.  If she uses less often, then irritation/itching recurs.  Wants to know if it is safe to use every day.   The following portions of the chart were reviewed this encounter and updated as appropriate: medications, allergies, medical history  Review of Systems:  No other skin or systemic complaints except as noted in HPI or Assessment and Plan.  Objective  Well appearing patient in no apparent distress; mood and affect are within normal limits.   A focused examination was performed of the following areas: Vaginal area   Relevant exam findings are noted in the Assessment and Plan.    Assessment & Plan   Lichen Sclerosis et Atrophicus vs irritant dermatitis Vulva    Exam: erythema on labia majora with some mild hypopigmentation, agglutination of labia minora inferiorly, no erosions  Lichen sclerosus is a chronic inflammatory condition of unknown cause that frequently involves the vaginal area and less commonly extragenital skin, and is NOT sexually transmitted. It frequently causes symptoms of pain and burning.  It requires regular monitoring and treatment with topical steroids to minimize inflammation and to reduce risk of scarring. There is also a risk of cancer in the vaginal area which is very low if inflammation is well controlled. Regular checks of the area are recommended. Please call if you notice any new or changing spots within this area.   Start tacrolimus 0.1% ointment - apply to aa once to twice daily  Start sample of Strata Mgt gel - apply topically to aa once to twice daily  Continue clobetasol ointment - continue once daily to aa. Avoid applying to face, groin, and axilla. Use as directed.  Long-term use can cause thinning of the skin.  If above topicals help control symptoms, then may be able to decrease frequency of use  Topical steroids (such as triamcinolone, fluocinolone, fluocinonide, mometasone, clobetasol, halobetasol, betamethasone, hydrocortisone) can cause thinning and lightening of the skin if they are used for too long in the same area. Your physician has selected the right strength medicine for your problem and area affected on the body. Please use your medication only as directed by your physician to prevent side effects.     Early Callus on right 2nd medial toe  Exam: pink scaly thin papule   Treatment Plan: Benign. Observe.  Discussed applying cushioned pad to area to protect.  Recommend follow up with podiatry to discuss treatment   Return for keep follow up as scheduled .  I, Asher Muir, CMA, am acting as scribe for Willeen Niece, MD.   Documentation: I have reviewed the above documentation for accuracy and completeness, and I agree with the above.  Willeen Niece, MD

## 2022-11-04 DIAGNOSIS — J4520 Mild intermittent asthma, uncomplicated: Secondary | ICD-10-CM | POA: Diagnosis not present

## 2022-11-04 DIAGNOSIS — R053 Chronic cough: Secondary | ICD-10-CM | POA: Diagnosis not present

## 2022-11-04 DIAGNOSIS — Z8616 Personal history of COVID-19: Secondary | ICD-10-CM | POA: Diagnosis not present

## 2022-11-04 DIAGNOSIS — R062 Wheezing: Secondary | ICD-10-CM | POA: Diagnosis not present

## 2022-11-04 DIAGNOSIS — R0989 Other specified symptoms and signs involving the circulatory and respiratory systems: Secondary | ICD-10-CM | POA: Diagnosis not present

## 2022-11-24 DIAGNOSIS — H2512 Age-related nuclear cataract, left eye: Secondary | ICD-10-CM | POA: Diagnosis not present

## 2022-11-24 DIAGNOSIS — B0052 Herpesviral keratitis: Secondary | ICD-10-CM | POA: Diagnosis not present

## 2022-11-24 DIAGNOSIS — Z961 Presence of intraocular lens: Secondary | ICD-10-CM | POA: Diagnosis not present

## 2022-11-24 DIAGNOSIS — H16223 Keratoconjunctivitis sicca, not specified as Sjogren's, bilateral: Secondary | ICD-10-CM | POA: Diagnosis not present

## 2022-11-26 DIAGNOSIS — Z79899 Other long term (current) drug therapy: Secondary | ICD-10-CM | POA: Diagnosis not present

## 2022-11-26 DIAGNOSIS — R5383 Other fatigue: Secondary | ICD-10-CM | POA: Diagnosis not present

## 2022-11-26 DIAGNOSIS — J0100 Acute maxillary sinusitis, unspecified: Secondary | ICD-10-CM | POA: Diagnosis not present

## 2022-12-22 DIAGNOSIS — M47812 Spondylosis without myelopathy or radiculopathy, cervical region: Secondary | ICD-10-CM | POA: Diagnosis not present

## 2022-12-22 DIAGNOSIS — M47816 Spondylosis without myelopathy or radiculopathy, lumbar region: Secondary | ICD-10-CM | POA: Diagnosis not present

## 2022-12-23 DIAGNOSIS — H179 Unspecified corneal scar and opacity: Secondary | ICD-10-CM | POA: Diagnosis not present

## 2022-12-23 DIAGNOSIS — B0052 Herpesviral keratitis: Secondary | ICD-10-CM | POA: Diagnosis not present

## 2022-12-23 DIAGNOSIS — H16223 Keratoconjunctivitis sicca, not specified as Sjogren's, bilateral: Secondary | ICD-10-CM | POA: Diagnosis not present

## 2023-01-19 ENCOUNTER — Encounter: Payer: Self-pay | Admitting: Dermatology

## 2023-01-19 ENCOUNTER — Ambulatory Visit: Payer: PPO | Admitting: Dermatology

## 2023-01-19 DIAGNOSIS — D225 Melanocytic nevi of trunk: Secondary | ICD-10-CM

## 2023-01-19 DIAGNOSIS — L249 Irritant contact dermatitis, unspecified cause: Secondary | ICD-10-CM

## 2023-01-19 DIAGNOSIS — W908XXA Exposure to other nonionizing radiation, initial encounter: Secondary | ICD-10-CM | POA: Diagnosis not present

## 2023-01-19 DIAGNOSIS — L578 Other skin changes due to chronic exposure to nonionizing radiation: Secondary | ICD-10-CM | POA: Diagnosis not present

## 2023-01-19 DIAGNOSIS — D692 Other nonthrombocytopenic purpura: Secondary | ICD-10-CM

## 2023-01-19 DIAGNOSIS — L821 Other seborrheic keratosis: Secondary | ICD-10-CM

## 2023-01-19 DIAGNOSIS — L814 Other melanin hyperpigmentation: Secondary | ICD-10-CM

## 2023-01-19 DIAGNOSIS — L219 Seborrheic dermatitis, unspecified: Secondary | ICD-10-CM | POA: Diagnosis not present

## 2023-01-19 DIAGNOSIS — D1801 Hemangioma of skin and subcutaneous tissue: Secondary | ICD-10-CM

## 2023-01-19 DIAGNOSIS — D229 Melanocytic nevi, unspecified: Secondary | ICD-10-CM | POA: Diagnosis not present

## 2023-01-19 DIAGNOSIS — L820 Inflamed seborrheic keratosis: Secondary | ICD-10-CM

## 2023-01-19 DIAGNOSIS — Z1283 Encounter for screening for malignant neoplasm of skin: Secondary | ICD-10-CM

## 2023-01-19 NOTE — Patient Instructions (Addendum)
Eucerin Advanced cream, Cerave cream, Cetaphil cream for moisturizer   Due to recent changes in healthcare laws, you may see results of your pathology and/or laboratory studies on MyChart before the doctors have had a chance to review them. We understand that in some cases there may be results that are confusing or concerning to you. Please understand that not all results are received at the same time and often the doctors may need to interpret multiple results in order to provide you with the best plan of care or course of treatment. Therefore, we ask that you please give [place] 2 business days to thoroughly review all your results before contacting the office for clarification. Should we see a critical lab result, you will be contacted sooner.   If You Need Anything After Your Visit  If you have any questions or concerns for your doctor, please call our main line at [phone] and press option 4 to reach your doctor's medical assistant. If no one answers, please leave a voicemail as directed and we will return your call as soon as possible. Messages left after 4 pm will be answered the following business day.   You may also send [place] a message via MyChart. We typically respond to MyChart messages within 1-2 business days.  For prescription refills, please ask your pharmacy to contact our office. Our fax number is [phone].  If you have an urgent issue when the clinic is closed that cannot wait until the next business day, you can page your doctor at the number below.    Please note that while we do our best to be available for urgent issues outside of office hours, we are not available 24/7.   If you have an urgent issue and are unable to reach [place], you may choose to seek medical care at your doctor's office, retail clinic, urgent care center, or emergency room.  If you have a medical emergency, please immediately call 911 or go to the emergency department.  Pager Numbers  - Dr. Gwen Pounds:  [phone]  - Dr. Roseanne Reno: [phone]  - Dr. Katrinka Blazing: [phone]   In the event of inclement weather, please call our main line at [phone] for an update on the status of any delays or closures.  Dermatology Medication Tips: Please keep the boxes that topical medications come in in order to help keep track of the instructions about where and how to use these. Pharmacies typically print the medication instructions only on the boxes and not directly on the medication tubes.   If your medication is too expensive, please contact our office at [phone] option 4 or send [place] a message through MyChart.   We are unable to tell what your co-pay for medications will be in advance as this is different depending on your insurance coverage. However, we may be able to find a substitute medication at lower cost or fill out paperwork to get insurance to cover a needed medication.   If a prior authorization is required to get your medication covered by your insurance company, please allow [place] 1-2 business days to complete this process.  Drug prices often vary depending on where the prescription is filled and some pharmacies may offer cheaper prices.  The website www.goodrx.com contains coupons for medications through different pharmacies. The prices here do not account for what the cost may be with help from insurance (it may be cheaper with your insurance), but the website can give you the price if you did not use any insurance.  -  You can print the associated coupon and take it with your prescription to the pharmacy.  - You may also stop by our office during regular business hours and pick up a GoodRx coupon card.  - If you need your prescription sent electronically to a different pharmacy, notify our office through [facility] or by phone at [phone] option 4.     Si Usted Necesita Algo Despus de Su Visita  Tambin puede enviarnos un mensaje a travs de Clinical cytogeneticist. Por lo general  respondemos a los mensajes de MyChart en el transcurso de 1 a 2 das hbiles.  Para renovar recetas, por favor pida a su farmacia que se ponga en contacto con nuestra oficina. Annie Sable de fax es [place] [phone].  Si tiene un asunto urgente cuando la clnica est cerrada y que no puede esperar hasta el siguiente da hbil, puede llamar/localizar a su doctor(a) al nmero que aparece a continuacin.   Por favor, tenga en cuenta que aunque hacemos todo lo posible para estar disponibles para asuntos urgentes fuera del horario de [place], no estamos disponibles las 24 horas del da, los 7 [address]  [address] de la [place].   Si tiene un problema urgente y no puede comunicarse con nosotros, puede optar por buscar atencin mdica  en el consultorio de su doctor(a), en una clnica privada, en un centro de atencin urgente o en una sala de emergencias.  Si tiene Engineer, drilling, por favor llame inmediatamente al 911 o vaya a la sala de emergencias.  Nmeros de bper  - Dr. Gwen Pounds: [phone]  - Dra. Roseanne Reno: [phone]  - Dr. Katrinka Blazing: [phone]   En caso de inclemencias del tiempo, por favor llame a Lacy Duverney principal al [phone] para una actualizacin sobre el [place] de cualquier retraso o cierre.  Consejos para la medicacin en dermatologa: Por favor, guarde las cajas en las que vienen los medicamentos de uso tpico para ayudarle a seguir las instrucciones sobre dnde y cmo usarlos. Las farmacias generalmente imprimen las instrucciones del medicamento slo en las cajas y no directamente en los tubos del [place].   Si su medicamento es muy caro, por favor, pngase en contacto con Rolm Gala llamando al [phone] y presione la opcin 4 o envenos un mensaje a travs de Clinical cytogeneticist.   No podemos decirle cul ser su copago por los medicamentos por adelantado ya que esto es diferente dependiendo de la cobertura de su seguro. Sin embargo, es posible que podamos encontrar un  medicamento sustituto a Audiological scientist un formulario para que el seguro cubra el medicamento que se considera necesario.   Si se requiere una autorizacin previa para que su compaa de seguros [place] su medicamento, por favor permtanos de 1 a 2 das hbiles para completar [address]  Los precios de los medicamentos varan con frecuencia dependiendo del Environmental consultant de dnde se surte la receta y alguna farmacias pueden ofrecer precios ms baratos.  El sitio web www.goodrx.com tiene cupones para medicamentos de Health and safety inspector. Los precios aqu no tienen en cuenta lo que podra costar con la ayuda del seguro (puede ser ms barato con su seguro), pero el sitio web puede darle el precio si no utiliz Tourist information centre manager.  - Puede imprimir el cupn correspondiente y llevarlo con su receta a la farmacia.  - Tambin puede pasar por nuestra oficina durante el horario de atencin regular y Education officer, museum una tarjeta de cupones de GoodRx.  - Si necesita que su receta se enve electrnicamente a [employer],  informe a nuestra oficina a travs de MyChart de [place] o por telfono llamando al [phone] y presione la opcin 4.

## 2023-01-19 NOTE — Progress Notes (Signed)
Follow-Up Visit   Subjective  Amy Wells is a 82 y.o. female who presents for the following: Skin Cancer Screening and Full Body Skin Exam LS&A vs Irritant derm vulva, Strata MGT gel prn, Seb Derm Dermotoic oil ~qd  The patient presents for Total-Body Skin Exam (TBSE) for skin cancer screening and mole check. The patient has spots, moles and lesions to be evaluated, some may be new or changing and the patient may have concern these could be cancer.    The following portions of the chart were reviewed this encounter and updated as appropriate: medications, allergies, medical history  Review of Systems:  No other skin or systemic complaints except as noted in HPI or Assessment and Plan.  Objective  Well appearing patient in no apparent distress; mood and affect are within normal limits.  A full examination was performed including scalp, head, eyes, ears, nose, lips, neck, chest, axillae, abdomen, back, buttocks, bilateral upper extremities, bilateral lower extremities, hands, feet, fingers, toes, fingernails, and toenails. All findings within normal limits unless otherwise noted below. Vulvar exam also performed today  Relevant physical exam findings are noted in the Assessment and Plan.  R ant upper thigh Stuck on waxy pap with erythema    Assessment & Plan   SKIN CANCER SCREENING PERFORMED TODAY.  ACTINIC DAMAGE - Chronic condition, secondary to cumulative UV/sun exposure - diffuse scaly erythematous macules with underlying dyspigmentation - Recommend daily broad spectrum sunscreen SPF 30+ to sun-exposed areas, reapply every 2 hours as needed.  - Staying in the shade or wearing long sleeves, sun glasses (UVA+UVB protection) and wide brim hats (4-inch brim around the entire circumference of the hat) are also recommended for sun protection.  - Call for new or changing lesions.  LENTIGINES, SEBORRHEIC KERATOSES, HEMANGIOMAS - Benign normal skin lesions -  Benign-appearing - Call for any changes  MELANOCYTIC NEVI - Tan-brown and/or pink-flesh-colored symmetric macules and papules - Benign appearing on exam today - Observation - Call clinic for new or changing moles - Recommend daily use of broad spectrum spf 30+ sunscreen to sun-exposed areas.  - R spinal lower back 2.83mm med dark brown macule, stable  SEBORRHEIC DERMATITIS ears Exam: scaling of the R ear canal  Chronic and persistent condition with duration or expected duration over one year. Condition is improving with treatment but not currently at goal.    Seborrheic Dermatitis is a chronic persistent rash characterized by pinkness and scaling most commonly of the mid face but also can occur on the scalp (dandruff), ears; mid chest, mid back and groin.  It tends to be exacerbated by stress and cooler weather.  People who have neurologic disease may experience new onset or exacerbation of existing seborrheic dermatitis.  The condition is not curable but treatable and can be controlled.  Treatment Plan: Cont Dermotic oil qd/bid prn flares  Topical steroids (such as triamcinolone, fluocinolone, fluocinonide, mometasone, clobetasol, halobetasol, betamethasone, hydrocortisone) can cause thinning and lightening of the skin if they are used for too long in the same area. Your physician has selected the right strength medicine for your problem and area affected on the body. Please use your medication only as directed by your physician to prevent side effects.     IRRITANT DERMATITIS, Resolved Vulva Exam: clear today, no evidence of LS&A on exam today  Treatment Plan: Cont Strata MGT gel prn flares   Inflamed seborrheic keratosis R ant upper thigh  Reassured benign age-related growth.  Recommend observation.  Discussed cryotherapy if  spot(s) become irritated or inflamed.   Pt defers treatment today since not bothersome Discussed if gets irritated can use otc HC cream  Purpura -  Chronic; persistent and recurrent.  Treatable, but not curable. - Violaceous macules and patches - Benign - Related to trauma, age, sun damage and/or use of blood thinners, chronic use of topical and/or oral steroids - Observe - Can use OTC arnica containing moisturizer such as Dermend Bruise Formula if desired - Call for worsening or other concerns  Return in about 1 year (around 01/19/2024) for TBSE.  I, Ardis Rowan, RMA, am acting as scribe for Willeen Niece, MD .   Documentation: I have reviewed the above documentation for accuracy and completeness, and I agree with the above.  Willeen Niece, MD

## 2023-01-30 DIAGNOSIS — Z79899 Other long term (current) drug therapy: Secondary | ICD-10-CM | POA: Diagnosis not present

## 2023-01-30 DIAGNOSIS — E039 Hypothyroidism, unspecified: Secondary | ICD-10-CM | POA: Diagnosis not present

## 2023-01-30 DIAGNOSIS — E782 Mixed hyperlipidemia: Secondary | ICD-10-CM | POA: Diagnosis not present

## 2023-01-30 DIAGNOSIS — J4530 Mild persistent asthma, uncomplicated: Secondary | ICD-10-CM | POA: Diagnosis not present

## 2023-01-30 DIAGNOSIS — Z0000 Encounter for general adult medical examination without abnormal findings: Secondary | ICD-10-CM | POA: Diagnosis not present

## 2023-01-30 DIAGNOSIS — R739 Hyperglycemia, unspecified: Secondary | ICD-10-CM | POA: Diagnosis not present

## 2023-02-12 DIAGNOSIS — R0602 Shortness of breath: Secondary | ICD-10-CM | POA: Diagnosis not present

## 2023-02-12 DIAGNOSIS — J439 Emphysema, unspecified: Secondary | ICD-10-CM | POA: Diagnosis not present

## 2023-02-12 DIAGNOSIS — J4541 Moderate persistent asthma with (acute) exacerbation: Secondary | ICD-10-CM | POA: Diagnosis not present

## 2023-03-17 DIAGNOSIS — J45991 Cough variant asthma: Secondary | ICD-10-CM | POA: Diagnosis not present

## 2023-03-17 DIAGNOSIS — J4541 Moderate persistent asthma with (acute) exacerbation: Secondary | ICD-10-CM | POA: Diagnosis not present

## 2023-04-14 ENCOUNTER — Other Ambulatory Visit: Payer: Self-pay | Admitting: Internal Medicine

## 2023-04-14 DIAGNOSIS — Z1231 Encounter for screening mammogram for malignant neoplasm of breast: Secondary | ICD-10-CM

## 2023-05-01 ENCOUNTER — Ambulatory Visit
Admission: RE | Admit: 2023-05-01 | Discharge: 2023-05-01 | Disposition: A | Discharge status: Home / Self Care | Payer: PPO | Source: Ambulatory Visit | Attending: Internal Medicine | Admitting: Internal Medicine

## 2023-05-01 DIAGNOSIS — Z1231 Encounter for screening mammogram for malignant neoplasm of breast: Secondary | ICD-10-CM | POA: Insufficient documentation

## 2023-06-29 DIAGNOSIS — B0052 Herpesviral keratitis: Secondary | ICD-10-CM | POA: Diagnosis not present

## 2023-06-29 DIAGNOSIS — H16223 Keratoconjunctivitis sicca, not specified as Sjogren's, bilateral: Secondary | ICD-10-CM | POA: Diagnosis not present

## 2023-06-29 DIAGNOSIS — H179 Unspecified corneal scar and opacity: Secondary | ICD-10-CM | POA: Diagnosis not present

## 2023-07-13 ENCOUNTER — Other Ambulatory Visit: Payer: Self-pay | Admitting: Dermatology

## 2023-07-13 DIAGNOSIS — L219 Seborrheic dermatitis, unspecified: Secondary | ICD-10-CM

## 2023-11-23 DIAGNOSIS — M47812 Spondylosis without myelopathy or radiculopathy, cervical region: Secondary | ICD-10-CM | POA: Diagnosis not present

## 2023-12-15 DIAGNOSIS — J4540 Moderate persistent asthma, uncomplicated: Secondary | ICD-10-CM | POA: Diagnosis not present

## 2023-12-25 DIAGNOSIS — M542 Cervicalgia: Secondary | ICD-10-CM | POA: Diagnosis not present

## 2023-12-28 DIAGNOSIS — H2512 Age-related nuclear cataract, left eye: Secondary | ICD-10-CM | POA: Diagnosis not present

## 2023-12-28 DIAGNOSIS — H16223 Keratoconjunctivitis sicca, not specified as Sjogren's, bilateral: Secondary | ICD-10-CM | POA: Diagnosis not present

## 2023-12-28 DIAGNOSIS — Z961 Presence of intraocular lens: Secondary | ICD-10-CM | POA: Diagnosis not present

## 2023-12-28 DIAGNOSIS — H179 Unspecified corneal scar and opacity: Secondary | ICD-10-CM | POA: Diagnosis not present

## 2023-12-31 DIAGNOSIS — M542 Cervicalgia: Secondary | ICD-10-CM | POA: Diagnosis not present

## 2024-01-05 DIAGNOSIS — M542 Cervicalgia: Secondary | ICD-10-CM | POA: Diagnosis not present

## 2024-01-18 ENCOUNTER — Encounter: Payer: Self-pay | Admitting: Dermatology

## 2024-01-18 ENCOUNTER — Ambulatory Visit: Payer: PPO | Admitting: Dermatology

## 2024-01-18 DIAGNOSIS — Z1283 Encounter for screening for malignant neoplasm of skin: Secondary | ICD-10-CM

## 2024-01-18 DIAGNOSIS — L821 Other seborrheic keratosis: Secondary | ICD-10-CM

## 2024-01-18 DIAGNOSIS — D229 Melanocytic nevi, unspecified: Secondary | ICD-10-CM

## 2024-01-18 DIAGNOSIS — L820 Inflamed seborrheic keratosis: Secondary | ICD-10-CM | POA: Diagnosis not present

## 2024-01-18 DIAGNOSIS — D225 Melanocytic nevi of trunk: Secondary | ICD-10-CM | POA: Diagnosis not present

## 2024-01-18 DIAGNOSIS — L570 Actinic keratosis: Secondary | ICD-10-CM | POA: Diagnosis not present

## 2024-01-18 DIAGNOSIS — D1801 Hemangioma of skin and subcutaneous tissue: Secondary | ICD-10-CM

## 2024-01-18 DIAGNOSIS — L219 Seborrheic dermatitis, unspecified: Secondary | ICD-10-CM

## 2024-01-18 DIAGNOSIS — L814 Other melanin hyperpigmentation: Secondary | ICD-10-CM | POA: Diagnosis not present

## 2024-01-18 DIAGNOSIS — W908XXA Exposure to other nonionizing radiation, initial encounter: Secondary | ICD-10-CM

## 2024-01-18 DIAGNOSIS — L578 Other skin changes due to chronic exposure to nonionizing radiation: Secondary | ICD-10-CM

## 2024-01-18 DIAGNOSIS — I781 Nevus, non-neoplastic: Secondary | ICD-10-CM

## 2024-01-18 NOTE — Patient Instructions (Addendum)

## 2024-01-18 NOTE — Progress Notes (Signed)
 Follow-Up Visit   Subjective  Amy Wells is a 83 y.o. female who presents for the following: Skin Cancer Screening and Full Body Skin Exam, Seb derm ears, Dermotic  oil prn, growth forehead get irritated- picks at, also at right post shoulder  The patient presents for Total-Body Skin Exam (TBSE) for skin cancer screening and mole check. The patient has spots, moles and lesions to be evaluated, some may be new or changing and the patient may have concern these could be cancer.    The following portions of the chart were reviewed this encounter and updated as appropriate: medications, allergies, medical history  Review of Systems:  No other skin or systemic complaints except as noted in HPI or Assessment and Plan.  Objective  Well appearing patient in no apparent distress; mood and affect are within normal limits.  A full examination was performed including scalp, head, eyes, ears, nose, lips, neck, chest, axillae, abdomen, back, buttocks, bilateral upper extremities, bilateral lower extremities, hands, feet, fingers, toes, fingernails, and toenails. All findings within normal limits unless otherwise noted below.   Relevant physical exam findings are noted in the Assessment and Plan.  R mid ear helix x 1 Pink scaly macule L frontal hairline x 1, R post axilla x 3 (4) Stuck on waxy papules with erythema  Assessment & Plan   SKIN CANCER SCREENING PERFORMED TODAY.  ACTINIC DAMAGE - Chronic condition, secondary to cumulative UV/sun exposure - diffuse scaly erythematous macules with underlying dyspigmentation - Recommend daily broad spectrum sunscreen SPF 30+ to sun-exposed areas, reapply every 2 hours as needed.  - Staying in the shade or wearing long sleeves, sun glasses (UVA+UVB protection) and wide brim hats (4-inch brim around the entire circumference of the hat) are also recommended for sun protection.  - Call for new or changing lesions.  LENTIGINES, SEBORRHEIC  KERATOSES, HEMANGIOMAS - Benign normal skin lesions - Benign-appearing - Call for any changes - SK L flank  MELANOCYTIC NEVI - Tan-brown and/or pink-flesh-colored symmetric macules and papules - R spinal lower back 2.10mm med dark brown macule - Benign appearing on exam today - Observation, Stable - Call clinic for new or changing moles - Recommend daily use of broad spectrum spf 30+ sunscreen to sun-exposed areas.    SEBORRHEIC DERMATITIS Exam: ears clear today  Chronic condition with duration or expected duration over one year. Currently well-controlled.  Seborrheic Dermatitis is a chronic persistent rash characterized by pinkness and scaling most commonly of the mid face but also can occur on the scalp (dandruff), ears; mid chest, mid back and groin.  It tends to be exacerbated by stress and cooler weather.  People who have neurologic disease may experience new onset or exacerbation of existing seborrheic dermatitis.  The condition is not curable but treatable and can be controlled.  Treatment Plan: Cont Dermotic  oil qd/bid prn flares   Topical steroids (such as triamcinolone , fluocinolone , fluocinonide, mometasone, clobetasol , halobetasol, betamethasone , hydrocortisone) can cause thinning and lightening of the skin if they are used for too long in the same area. Your physician has selected the right strength medicine for your problem and area affected on the body. Please use your medication only as directed by your physician to prevent side effects.   TELANGIECTASIAS cheeks Exam: dilated blood vessel(s)  Treatment Plan: Benign appearing on exam Call for changes  AK (ACTINIC KERATOSIS) R mid ear helix x 1 AK vs CDNH  Pt sleeps preferentially on R side- recommend using a travel pillow  Actinic  keratoses are precancerous spots that appear secondary to cumulative UV radiation exposure/sun exposure over time. They are chronic with expected duration over 1 year. A portion of  actinic keratoses will progress to squamous cell carcinoma of the skin. It is not possible to reliably predict which spots will progress to skin cancer and so treatment is recommended to prevent development of skin cancer.  Recommend daily broad spectrum sunscreen SPF 30+ to sun-exposed areas, reapply every 2 hours as needed.  Recommend staying in the shade or wearing long sleeves, sun glasses (UVA+UVB protection) and wide brim hats (4-inch brim around the entire circumference of the hat). Call for new or changing lesions. Destruction of lesion - R mid ear helix x 1  Destruction method: cryotherapy   Informed consent: discussed and consent obtained   Lesion destroyed using liquid nitrogen: Yes   Region frozen until ice ball extended beyond lesion: Yes   Outcome: patient tolerated procedure well with no complications   Post-procedure details: wound care instructions given   Additional details:  Prior to procedure, discussed risks of blister formation, small wound, skin dyspigmentation, or rare scar following cryotherapy. Recommend Vaseline ointment to treated areas while healing.   INFLAMED SEBORRHEIC KERATOSIS (4) L frontal hairline x 1, R post axilla x 3 (4) Symptomatic, irritating, patient would like treated. Destruction of lesion - L frontal hairline x 1, R post axilla x 3 (4)  Destruction method: cryotherapy   Informed consent: discussed and consent obtained   Lesion destroyed using liquid nitrogen: Yes   Region frozen until ice ball extended beyond lesion: Yes   Outcome: patient tolerated procedure well with no complications   Post-procedure details: wound care instructions given   Additional details:  Prior to procedure, discussed risks of blister formation, small wound, skin dyspigmentation, or rare scar following cryotherapy. Recommend Vaseline ointment to treated areas while healing.    Return in about 1 year (around 01/17/2025) for TBSE, Hx of AKs.  I, Grayce Saunas, RMA, am  acting as scribe for Rexene Rattler, MD .   Documentation: I have reviewed the above documentation for accuracy and completeness, and I agree with the above.  Rexene Rattler, MD

## 2024-01-26 DIAGNOSIS — R739 Hyperglycemia, unspecified: Secondary | ICD-10-CM | POA: Diagnosis not present

## 2024-01-26 DIAGNOSIS — E782 Mixed hyperlipidemia: Secondary | ICD-10-CM | POA: Diagnosis not present

## 2024-01-26 DIAGNOSIS — Z79899 Other long term (current) drug therapy: Secondary | ICD-10-CM | POA: Diagnosis not present

## 2025-01-23 ENCOUNTER — Encounter: Admitting: Dermatology
